# Patient Record
Sex: Male | Born: 1950 | Race: White | Hispanic: No | Marital: Single | Smoking: Former smoker
Health system: Southern US, Community
[De-identification: ages and names within clinical notes are randomized; demographics above are authoritative.]

## PROBLEM LIST (undated history)

## (undated) DIAGNOSIS — Z87442 Personal history of urinary calculi: Secondary | ICD-10-CM

## (undated) DIAGNOSIS — I447 Left bundle-branch block, unspecified: Secondary | ICD-10-CM

## (undated) DIAGNOSIS — J45909 Unspecified asthma, uncomplicated: Secondary | ICD-10-CM

## (undated) DIAGNOSIS — I5020 Unspecified systolic (congestive) heart failure: Secondary | ICD-10-CM

## (undated) DIAGNOSIS — I2510 Atherosclerotic heart disease of native coronary artery without angina pectoris: Secondary | ICD-10-CM

## (undated) DIAGNOSIS — Q600 Renal agenesis, unilateral: Secondary | ICD-10-CM

## (undated) DIAGNOSIS — I10 Essential (primary) hypertension: Secondary | ICD-10-CM

## (undated) DIAGNOSIS — I639 Cerebral infarction, unspecified: Secondary | ICD-10-CM

## (undated) DIAGNOSIS — I428 Other cardiomyopathies: Secondary | ICD-10-CM

## (undated) HISTORY — PX: TRANSURETHRAL RESECTION OF PROSTATE: SHX73

## (undated) HISTORY — PX: KNEE SURGERY: SHX244

## (undated) HISTORY — DX: Unspecified asthma, uncomplicated: J45.909

## (undated) HISTORY — DX: Renal agenesis, unilateral: Q60.0

## (undated) HISTORY — PX: TONSILLECTOMY: SUR1361

## (undated) HISTORY — PX: KIDNEY STONE SURGERY: SHX686

## (undated) HISTORY — PX: TURP VAPORIZATION: SUR1397

---

## 2016-04-22 ENCOUNTER — Encounter (HOSPITAL_COMMUNITY): Payer: Self-pay

## 2016-04-22 ENCOUNTER — Emergency Department (HOSPITAL_COMMUNITY)
Admission: EM | Admit: 2016-04-22 | Discharge: 2016-04-23 | Disposition: A | Discharge status: Home / Self Care | Payer: BLUE CROSS/BLUE SHIELD | Attending: Emergency Medicine | Admitting: Emergency Medicine

## 2016-04-22 DIAGNOSIS — K269 Duodenal ulcer, unspecified as acute or chronic, without hemorrhage or perforation: Secondary | ICD-10-CM | POA: Diagnosis not present

## 2016-04-22 DIAGNOSIS — K200 Eosinophilic esophagitis: Secondary | ICD-10-CM | POA: Insufficient documentation

## 2016-04-22 DIAGNOSIS — K222 Esophageal obstruction: Secondary | ICD-10-CM | POA: Diagnosis not present

## 2016-04-22 DIAGNOSIS — K2980 Duodenitis without bleeding: Secondary | ICD-10-CM

## 2016-04-22 DIAGNOSIS — X58XXXA Exposure to other specified factors, initial encounter: Secondary | ICD-10-CM | POA: Diagnosis not present

## 2016-04-22 DIAGNOSIS — T18128A Food in esophagus causing other injury, initial encounter: Secondary | ICD-10-CM | POA: Diagnosis not present

## 2016-04-22 DIAGNOSIS — I10 Essential (primary) hypertension: Secondary | ICD-10-CM | POA: Insufficient documentation

## 2016-04-22 DIAGNOSIS — T18108A Unspecified foreign body in esophagus causing other injury, initial encounter: Secondary | ICD-10-CM

## 2016-04-22 HISTORY — DX: Essential (primary) hypertension: I10

## 2016-04-22 MED ORDER — SODIUM CHLORIDE 0.9 % IV SOLN
INTRAVENOUS | Status: DC
  Administered 2016-04-22: via INTRAVENOUS

## 2016-04-22 NOTE — ED Triage Notes (Signed)
I was eating chicken around 1830 and some is stuck in my throat per pt.  Can't eat or drink anything, and if I am just sitting my throat fills up with foam.

## 2016-04-22 NOTE — ED Provider Notes (Signed)
[facility] DEPT Provider Note   CSN: [id number] Arrival date & time: 04/22/16  2150  First Provider Contact:   First MD Initiated Contact with Patient 04/22/16 2308    By signing my name below, I, Emmanuella Mensah, attest that this documentation has been prepared under the direction and in the presence of Rolland Porter, MD. Electronically Signed: Judithann Sauger, ED Scribe. 04/22/16. 11:29 PM.    History   Chief Complaint Chief Complaint  Patient presents with  . Foreign Body    HPI Comments: Lucas Brock is a 66 y.o. male with a hx of hypertenstion who presents to the Emergency Department for evaluation s/p possible chicken stuck lower in his throat after eating chicken at 6:30 pm today. He states he only took a small bite. He reports an associated "tightness in throat" onset 2-3 days ago where he denies feeling as though food has a hard time getting down. He adds that he has difficulty swallowing his own saliva, causing him to have to spit in a bag. Pt is unable to eat or drink anything right now. No alleviating factors noted. He has not tried any medications PTA. He states that he is currently on Lisinopril and Metformin (but not for DM, but for pancreas maintenance) which he gets refilled at [facility] in [place], [place] by Dr. Dorna Mai. 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 He denies that he is current smoker but endorses an occasional use of ETOH. He reports a similar hx of these symptoms 5-6 years ago where he had the food  Removed by endoscopy  in [place], [place]. He states he did not have his esophagus stretched and had no follow up afterwards.  No fever, chills, cough, or shortness of breath.    The history is provided by the patient. No language interpreter was used.   [facility] in [place], [place], Dr Luciano Cutter  Past Medical History:  Diagnosis Date  . Hypertension     There are no active problems to display for this patient.   Past Surgical History:  Procedure  Laterality Date  . KIDNEY STONE SURGERY    . KNEE SURGERY    . TONSILLECTOMY    . TURP VAPORIZATION        Home Medications    Prior to Admission medications   Medication Sig Start Date End Date Taking? Authorizing Provider  pantoprazole (PROTONIX) 40 MG tablet Take 1 tablet (40 mg total) by mouth daily before breakfast. 04/23/16   Rogene Houston, MD    Family History History reviewed. No pertinent family history.  Social History Social History  Substance Use Topics  . Smoking status: Never Smoker  . Smokeless tobacco: Never Used  . Alcohol use Yes  drives a truck Pt self urinary caths   Allergies   Bactrim [sulfamethoxazole-trimethoprim]   Review of Systems Review of Systems  Constitutional: Negative for chills and fever.  HENT: Positive for trouble swallowing.   Respiratory: Negative for cough and shortness of breath.   All other systems reviewed and are negative.    Physical Exam Updated Vital Signs    ED Triage Vitals [04/22/16 2214]  Enc Vitals Group     BP 112/80     Pulse Rate 77     Resp 20     Temp 97.7 F (36.5 C)     Temp Source Oral     SpO2 94 %     Weight      Height      Head  Circumference      Peak Flow      Pain Score      Pain Loc      Pain Edu?      Excl. in [place]?    Vital signs normal    Physical Exam  Constitutional: He is oriented to person, place, and time. He appears well-developed and well-nourished.  Non-toxic appearance. He does not appear ill. No distress.  HENT:  Head: Normocephalic and atraumatic.  Right Ear: External ear normal.  Left Ear: External ear normal.  Nose: Nose normal. No mucosal edema or rhinorrhea.  Mouth/Throat: Oropharynx is clear and moist and mucous membranes are normal. No dental abscesses or uvula swelling.  Eyes: Conjunctivae and EOM are normal. Pupils are equal, round, and reactive to light.  Neck: Normal range of motion and full passive range of motion without pain. Neck supple.    Cardiovascular: Normal rate, regular rhythm and normal heart sounds.  Exam reveals no gallop and no friction rub.   No murmur heard. Pulmonary/Chest: Effort normal and breath sounds normal. No respiratory distress. He has no wheezes. He has no rhonchi. He has no rales. He exhibits no tenderness and no crepitus.  Abdominal: Soft. Normal appearance and bowel sounds are normal. He exhibits no distension. There is no tenderness. There is no rebound and no guarding.  Musculoskeletal: Normal range of motion. He exhibits no edema or tenderness.  Moves all extremities well.   Neurological: He is alert and oriented to person, place, and time. He has normal strength. No cranial nerve deficit.  Skin: Skin is warm, dry and intact. No rash noted. No erythema. No pallor.  Psychiatric: He has a normal mood and affect. His speech is normal and behavior is normal. His mood appears not anxious.  Nursing note and vitals reviewed.    ED Treatments / Results  DIAGNOSTIC STUDIES: Oxygen Saturation is 96% on RA, normal by my interpretation.     Labs (all labs ordered are listed, but only abnormal results are displayed) Results for orders placed or performed during the hospital encounter of 04/22/16  I-Stat Chem 8, ED  Result Value Ref Range   Sodium 144 135 - 145 mmol/L   Potassium 4.3 3.5 - 5.1 mmol/L   Chloride 106 101 - 111 mmol/L   BUN 27 (H) 6 - 20 mg/dL   Creatinine, Ser 1.10 0.61 - 1.24 mg/dL   Glucose, Bld 97 65 - 99 mg/dL   Calcium, Ion 1.25 (H) 1.12 - 1.23 mmol/L   TCO2 25 0 - 100 mmol/L   Hemoglobin 17.7 (H) 13.0 - 17.0 g/dL   HCT 52.0 39.0 - 52.0 %   Laboratory interpretation all normal except elevated Hb    EKG  EKG Interpretation None       Radiology No results found.  Procedures Procedures (including critical care time)  Medications Ordered in ED Medications  0.9 %  sodium chloride infusion ( Intravenous New Bag/Given 04/22/16 2349)     Initial Impression / Assessment  and Plan / ED Course  Rolland Porter, MD has reviewed the triage vital signs and the nursing notes.  Pertinent labs & imaging results that were available during my care of the patient were reviewed by me and considered in my medical decision making (see chart for details).  Clinical Course   Pt was given a cup with a small amount of water. He drank it and immediately vomited it back up.   11:27 PM- Pt advised of plan for  treatment and pt agrees. IV was started. Labs ordered. Will consult GI for further evaluation.   12:13 AM - Dr. Dereck Leep (GI) is calling in the endoscopy team to see pt tonight.   Final Clinical Impressions(s) / ED Diagnoses   Final diagnoses:  Esophageal foreign body, initial encounter   Disposition per Dr Laural Golden  (discharged home after EDG)  New Prescriptions Current Discharge Medication List    START taking these medications   Details  pantoprazole (PROTONIX) 40 MG tablet Take 1 tablet (40 mg total) by mouth daily before breakfast. Qty: 30 tablet, Refills: 5        I personally performed the services described in this documentation, which was scribed in my presence. The recorded information has been reviewed and considered.  Rolland Porter, MD, Barbette Or, MD 04/23/16 [phone]

## 2016-04-23 ENCOUNTER — Encounter (HOSPITAL_COMMUNITY): Payer: Self-pay | Admitting: *Deleted

## 2016-04-23 ENCOUNTER — Encounter (HOSPITAL_COMMUNITY)
Admission: EM | Disposition: A | Discharge status: Home / Self Care | Payer: Self-pay | Source: Home / Self Care | Attending: Emergency Medicine

## 2016-04-23 DIAGNOSIS — K3189 Other diseases of stomach and duodenum: Secondary | ICD-10-CM

## 2016-04-23 DIAGNOSIS — T18128A Food in esophagus causing other injury, initial encounter: Secondary | ICD-10-CM

## 2016-04-23 DIAGNOSIS — K209 Esophagitis, unspecified: Secondary | ICD-10-CM | POA: Diagnosis not present

## 2016-04-23 DIAGNOSIS — K222 Esophageal obstruction: Secondary | ICD-10-CM | POA: Diagnosis not present

## 2016-04-23 HISTORY — PX: ESOPHAGOGASTRODUODENOSCOPY: SHX5428

## 2016-04-23 LAB — I-STAT CHEM 8, ED
BUN: 27 mg/dL — AB (ref 6–20)
CALCIUM ION: 1.25 mmol/L — AB (ref 1.12–1.23)
CHLORIDE: 106 mmol/L (ref 101–111)
Creatinine, Ser: 1.1 mg/dL (ref 0.61–1.24)
GLUCOSE: 97 mg/dL (ref 65–99)
HCT: 52 % (ref 39.0–52.0)
Hemoglobin: 17.7 g/dL — ABNORMAL HIGH (ref 13.0–17.0)
Potassium: 4.3 mmol/L (ref 3.5–5.1)
SODIUM: 144 mmol/L (ref 135–145)
TCO2: 25 mmol/L (ref 0–100)

## 2016-04-23 SURGERY — EGD (ESOPHAGOGASTRODUODENOSCOPY)
Anesthesia: Moderate Sedation

## 2016-04-23 MED ORDER — STERILE WATER FOR IRRIGATION IR SOLN
Status: DC | PRN
  Administered 2016-04-23: 01:00:00

## 2016-04-23 MED ORDER — MEPERIDINE HCL 50 MG/ML IJ SOLN
INTRAMUSCULAR | Status: DC | PRN
  Administered 2016-04-23 (×2): 25 mg via INTRAVENOUS

## 2016-04-23 MED ORDER — BUTAMBEN-TETRACAINE-BENZOCAINE 2-2-14 % EX AERO
INHALATION_SPRAY | CUTANEOUS | Status: DC | PRN
  Administered 2016-04-23: 2 via TOPICAL

## 2016-04-23 MED ORDER — ONDANSETRON HCL 4 MG/2ML IJ SOLN
INTRAMUSCULAR | Status: AC
  Filled 2016-04-23: qty 2

## 2016-04-23 MED ORDER — MIDAZOLAM HCL 5 MG/5ML IJ SOLN
INTRAMUSCULAR | Status: AC
  Filled 2016-04-23: qty 10

## 2016-04-23 MED ORDER — MEPERIDINE HCL 50 MG/ML IJ SOLN
INTRAMUSCULAR | Status: AC
  Filled 2016-04-23: qty 1

## 2016-04-23 MED ORDER — MIDAZOLAM HCL 5 MG/5ML IJ SOLN
INTRAMUSCULAR | Status: DC | PRN
  Administered 2016-04-23 (×2): 2 mg via INTRAVENOUS
  Administered 2016-04-23 (×2): 1 mg via INTRAVENOUS

## 2016-04-23 MED ORDER — PANTOPRAZOLE SODIUM 40 MG PO TBEC: 40.0000 mg | DELAYED_RELEASE_TABLET | Freq: Every day | ORAL | 5 refills | Status: DC

## 2016-04-23 NOTE — H&P (Signed)
Lucas Brock is an 65 y.o. male.   Chief Complaint: Patient is here for EGD with foreign body removal and possible esophageal dilation. HPI: Patient is 65 year old Caucasian male who had esophageal foreign body removed at hospital in [place] 6 years ago and has been doing well until a few days ago when he noted some tightness throat. This evening while he was eating chicken he noted that it got stuck in his esophagus and he was unable to drink liquids. He feels is a very small piece. Came to emergency room. He was evaluated and I was asked see patient for EGD and foreign body removal. Patient denies chronic heartburn. He has good appetite. He has lost 25 pounds this year voluntarily. His goal is to get below 200 pounds.  Past Medical History:  Diagnosis Date  . Hypertension     Past Surgical History:  Procedure Laterality Date  . KIDNEY STONE SURGERY    . KNEE SURGERY    . TONSILLECTOMY    . TURP VAPORIZATION      No family history on file. Social History:  reports that he has never smoked. He has never used smokeless tobacco. He reports that he drinks alcohol. He reports that he does not use drugs.  Allergies:  Allergies  Allergen Reactions  . Bactrim [Sulfamethoxazole-Trimethoprim]      (Not in a hospital admission)  Results for orders placed or performed during the hospital encounter of 04/22/16 (from the past 48 hour(s))  I-Stat Chem 8, ED     Status: Abnormal   Collection Time: 04/22/16 11:58 PM  Result Value Ref Range   Sodium 144 135 - 145 mmol/L   Potassium 4.3 3.5 - 5.1 mmol/L   Chloride 106 101 - 111 mmol/L   BUN 27 (H) 6 - 20 mg/dL   Creatinine, Ser 1.10 0.61 - 1.24 mg/dL   Glucose, Bld 97 65 - 99 mg/dL   Calcium, Ion 1.25 (H) 1.12 - 1.23 mmol/L   TCO2 25 0 - 100 mmol/L   Hemoglobin 17.7 (H) 13.0 - 17.0 g/dL   HCT 52.0 39.0 - 52.0 %   No results found.  ROS  Blood pressure 105/82, pulse 81, temperature 97.5 F (36.4 C), resp. rate 18, SpO2 93  %. Physical Exam  Constitutional: He appears well-developed and well-nourished.  HENT:  Mouth/Throat: Oropharynx is clear and moist.  Eyes: Conjunctivae are normal.  Neck: No thyromegaly present.  Cardiovascular: Normal rate, regular rhythm and normal heart sounds.   No murmur heard. Respiratory: Effort normal and breath sounds normal.  GI: Soft. He exhibits no distension and no mass. There is no tenderness.  Musculoskeletal: He exhibits no edema.  Lymphadenopathy:    He has no cervical adenopathy.  Neurological: He is alert.  Skin: Skin is warm and dry.     Assessment/Plan Esophageal foreign body. EGD with foreign the body removal and esophageal dilation.  Hildred Laser, MD 04/23/2016, 1:08 AM

## 2016-04-23 NOTE — Op Note (Signed)
[facility] Patient Name: Lucas Brock Procedure Date: 04/23/2016 1:13 AM MRN: [id number] Date of Birth: 28-Feb-1951 Attending MD: Hildred Laser , MD CSN: [id number] Age: 65 Admit Type: Emergency Department Procedure:                Upper GI endoscopy Indications:              Foreign body in the esophagus Providers:                Hildred Laser, MD, Rosina Lowenstein, RN, Purcell Nails.                            Tina Griffiths, Technician Referring MD:             Rolland Porter, MD Medicines:                Cetacaine spray, Ondansetron 6 mg IV, Meperidine 50                            mg IV, Midazolam 6 mg IV Complications:            No immediate complications. Estimated Blood Loss:     Estimated blood loss was minimal. Procedure:                Pre-Anesthesia Assessment:                           - Prior to the procedure, a History and Physical                            was performed, and patient medications and                            allergies were reviewed. The patient's tolerance of                            previous anesthesia was also reviewed. The risks                            and benefits of the procedure and the sedation                            options and risks were discussed with the patient.                            All questions were answered, and informed consent                            was obtained. Prior Anticoagulants: The patient has                            taken no previous anticoagulant or antiplatelet                            agents. ASA Grade Assessment: II - A patient with  mild systemic disease. After reviewing the risks                            and benefits, the patient was deemed in                            satisfactory condition to undergo the procedure.                           After obtaining informed consent, the endoscope was                            passed under direct vision. Throughout the            procedure, the patient's blood pressure, pulse, and                            oxygen saturations were monitored continuously. The                            [id number]) scope was introduced through the                            and advanced to the second part of duodenum. The                            upper GI endoscopy was accomplished without                            difficulty. The patient tolerated the procedure                            well. Scope In: 1:22:18 AM Scope Out: 1:39:38 AM Total Procedure Duration: 0 hours 17 minutes 20 seconds  Findings:      Food was found in the lower third of the esophagus. Removal was       accomplished with a Roth net and snare.      Mucosal changes including ringed esophagus, longitudinal furrows and       stenosis were found in the middle third of the esophagus and in the       lower third of the esophagus. A TTS dilator was passed through the       scope. Dilation with a 12-13.5-15 mm x 5.5 cm CRE balloon dilator was       performed to 15 mm. The dilation site was examined and showed moderate       improvement in luminal narrowing. Biopsies were taken with a cold       forceps for histology.      LA Grade B (one or more mucosal breaks greater than 5 mm, not extending       between the tops of two mucosal folds) esophagitis with no bleeding was       found 39 cm from the incisors.      The Z-line was regular and was found 39 cm from the incisors.      The entire examined stomach was normal.      Multiple erosions  without bleeding were found in the duodenal bulb.      The second portion of the duodenum was normal. Impression:               - Food was found in the esophagus. Removal was                            successful.                           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 Dilated. Biopsied.                           - LA Grade B acute esophagitis.                           - Z-line regular, 39 cm from the incisors.                           - Normal stomach.                           - Duodenal erosions without bleeding.                           - Normal second portion of the duodenum. Moderate Sedation:      Moderate (conscious) sedation was administered by the endoscopy nurse       and supervised by the endoscopist. The following parameters were       monitored: oxygen saturation, heart rate, blood pressure, CO2       capnography and response to care. Total physician intraservice time was       23 minutes. Recommendation:           - Patient has a contact number available for                            emergencies. The signs and symptoms of potential                            delayed complications were discussed with the                            patient. Return to normal activities tomorrow.                            Written discharge instructions were provided to the                            patient.                           -  Mechanical soft diet for 2 days.                           - Continue present medications.                           - No aspirin, ibuprofen, naproxen, or other                            non-steroidal anti-inflammatory drugs for 3 days.                           - Await pathology results.                           - Use Protonix (pantoprazole) 40 mg PO daily.                           - Await pathology results.                           - Return to GI clinic in 2 months. Procedure Code(s):        --- Professional ---                           [phone], Esophagogastroduodenoscopy, flexible,                            transoral; with removal of foreign body(s)                           43249, Esophagogastroduodenoscopy, flexible,                            transoral; with transendoscopic balloon dilation of                             esophagus (less than 30 mm diameter)                           43239, Esophagogastroduodenoscopy, flexible,                            transoral; with biopsy, single or multiple                           99152, Moderate sedation services provided by the                            same physician or other qualified health care                            professional performing the diagnostic or                            therapeutic service that the sedation supports,  requiring the presence of an independent trained                            observer to assist in the monitoring of the                            patient's level of consciousness and physiological                            status; initial 15 minutes of intraservice time,                            patient age 34 years or older                           [phone], Moderate sedation services; each additional                            15 minutes intraservice time Diagnosis Code(s):        --- Professional ---                           [phone], Food in esophagus causing other injury,                            initial encounter                           K20.9, Esophagitis, unspecified                           K26.9, Duodenal ulcer, unspecified as acute or                            chronic, without hemorrhage or perforation                           T18.108A, Unspecified foreign body in esophagus                            causing other injury, initial encounter CPT copyright 2016 American Medical Association. All rights reserved. The codes documented in this report are preliminary and upon coder review may  be revised to meet current compliance requirements. Hildred Laser, MD Hildred Laser, MD 04/23/2016 2:02:34 AM This report has been signed electronically. Number of Addenda: 0

## 2016-04-23 NOTE — Discharge Instructions (Signed)
No aspirin or NSAIDs for 3 days. Resume usual medications. Pantoprazole 40 mg by mouth 30 minutes before breakfast daily. Soft diet for 2 days. No driving for 24 hours. Physician will call with biopsy results.  Gastrointestinal Endoscopy, Care After Refer to this sheet in the next few weeks. These instructions provide you with information on caring for yourself after your procedure. Your caregiver may also give you more specific instructions. Your treatment has been planned according to current medical practices, but problems sometimes occur. Call your caregiver if you have any problems or questions after your procedure. HOME CARE INSTRUCTIONS  If you were given medicine to help you relax (sedative), do not drive, operate machinery, or sign important documents for 24 hours.  Avoid alcohol and hot or warm beverages for the first 24 hours after the procedure.  Only take over-the-counter or prescription medicines for pain, discomfort, or fever as directed by your caregiver. You may resume taking your normal medicines unless your caregiver tells you otherwise. Ask your caregiver when you may resume taking medicines that may cause bleeding, such as aspirin, clopidogrel, or warfarin.  You may return to your normal diet and activities on the day after your procedure, or as directed by your caregiver. Walking may help to reduce any bloated feeling in your abdomen.  Drink enough fluids to keep your urine clear or pale yellow.  You may gargle with salt water if you have a sore throat. SEEK IMMEDIATE MEDICAL CARE IF:  You have severe nausea or vomiting.  You have severe abdominal pain, abdominal cramps that last longer than 6 hours, or abdominal swelling (distention).  You have severe shoulder or back pain.  You have trouble swallowing.  You have shortness of breath, your breathing is shallow, or you are breathing faster than normal.  You have a fever or a rapid heartbeat.  You vomit blood  or material that looks like coffee grounds.  You have bloody, black, or tarry stools. MAKE SURE YOU:  Understand these instructions.  Will watch your condition.  Will get help right away if you are not doing well or get worse.   This information is not intended to replace advice given to you by your health care provider. Make sure you discuss any questions you have with your health care provider.   Document Released: 04/12/2004 Document Revised: 09/19/2014 Document Reviewed: 11/29/2011 Elsevier Interactive Patient Education 2016 [place] Meal Plan A soft-food meal plan includes foods that are safe and easy to swallow. This meal plan typically is used:  If you are having trouble chewing or swallowing foods.  As a transition meal plan after only having had liquid meals for a long period. WHAT DO I NEED TO KNOW ABOUT THE SOFT-FOOD MEAL PLAN? A soft-food meal plan includes tender foods that are soft and easy to chew and swallow. In most cases, bite-sized pieces of food are easier to swallow. A bite-sized piece is about  inch or smaller. Foods in this plan do not need to be ground or pureed. Foods that are very hard, crunchy, or sticky should be avoided. Also, breads, cereals, yogurts, and desserts with nuts, seeds, or fruits should be avoided. WHAT FOODS CAN I EAT? Grains Rice and wild rice. Moist bread, dressing, pasta, and noodles. Well-moistened dry or cooked cereals, such as farina (cooked wheat cereal), oatmeal, or grits. Biscuits, breads, muffins, pancakes, and waffles that have been well moistened. Vegetables Shredded lettuce. Cooked, tender vegetables, including potatoes without skins. Vegetable juices.  Broths or creamed soups made with vegetables that are not stringy or chewy. Strained tomatoes (without seeds). Fruits Canned or well-cooked fruits. Soft (ripe), peeled fresh fruits, such as peaches, nectarines, kiwi, cantaloupe, honeydew melon, and watermelon  (without seeds). Soft berries with small seeds, such as strawberries. Fruit juices (without pulp). Meats and Other Protein Sources Moist, tender, lean beef. Mutton. Lamb. Veal. Chicken. [place]. Liver. Ham. Fish without bones. Eggs. Dairy Milk, milk drinks, and cream. Plain cream cheese and cottage cheese. Plain yogurt. Sweets/Desserts Flavored gelatin desserts. Custard. Plain ice cream, frozen yogurt, sherbet, milk shakes, and malts. Plain cakes and cookies. Plain hard candy.  Other Butter, margarine (without trans fat), and cooking oils. Mayonnaise. Cream sauces. Mild spices, salt, and sugar. Syrup, molasses, honey, and jelly. The items listed above may not be a complete list of recommended foods or beverages. Contact your dietitian for more options. WHAT FOODS ARE NOT RECOMMENDED? Grains Dry bread, toast, crackers that have not been moistened. Coarse or dry cereals, such as bran, granola, and shredded wheat. Tough or chewy crusty breads, such as [place] bread or baguettes. Vegetables Corn. Raw vegetables except shredded lettuce. Cooked vegetables that are tough or stringy. Tough, crisp, fried potatoes and potato skins. Fruits Fresh fruits with skins or seeds or both, such as apples, pears, or grapes. Stringy, high-pulp fruits, such as papaya, pineapple, coconut, or mango. Fruit leather, fruit roll-ups, and all dried fruits. Meats and Other Protein Sources Sausages and hot dogs. Meats with gristle. Fish with bones. Nuts, seeds, and chunky peanut or other nut butters. Sweets/Desserts Cakes or cookies that are very dry or chewy.  The items listed above may not be a complete list of foods and beverages to avoid. Contact your dietitian for more information.   This information is not intended to replace advice given to you by your health care provider. Make sure you discuss any questions you have with your health care provider.   Document Released: 12/06/2007 Document Revised: 09/03/2013 Document  Reviewed: 07/26/2013 Elsevier Interactive Patient Education [employer].

## 2016-04-24 LAB — H. PYLORI ANTIBODY, IGG: H Pylori IgG: 1.8 U/mL — ABNORMAL HIGH (ref 0.0–0.8)

## 2016-04-27 ENCOUNTER — Other Ambulatory Visit (INDEPENDENT_AMBULATORY_CARE_PROVIDER_SITE_OTHER): Payer: Self-pay | Admitting: Internal Medicine

## 2016-04-27 MED ORDER — FLUTICASONE PROPIONATE HFA 220 MCG/ACT IN AERO: INHALATION_SPRAY | RESPIRATORY_TRACT | 0 refills | Status: DC

## 2016-04-28 ENCOUNTER — Encounter (INDEPENDENT_AMBULATORY_CARE_PROVIDER_SITE_OTHER): Payer: Self-pay | Admitting: *Deleted

## 2016-05-23 ENCOUNTER — Encounter (HOSPITAL_COMMUNITY): Payer: Self-pay | Admitting: Internal Medicine

## 2016-06-20 ENCOUNTER — Ambulatory Visit (INDEPENDENT_AMBULATORY_CARE_PROVIDER_SITE_OTHER): Payer: BLUE CROSS/BLUE SHIELD | Admitting: Internal Medicine

## 2016-06-20 ENCOUNTER — Encounter (INDEPENDENT_AMBULATORY_CARE_PROVIDER_SITE_OTHER): Payer: Self-pay | Admitting: Internal Medicine

## 2016-06-21 ENCOUNTER — Ambulatory Visit (INDEPENDENT_AMBULATORY_CARE_PROVIDER_SITE_OTHER): Payer: BLUE CROSS/BLUE SHIELD | Admitting: Internal Medicine

## 2016-07-08 ENCOUNTER — Telehealth (INDEPENDENT_AMBULATORY_CARE_PROVIDER_SITE_OTHER): Payer: Self-pay | Admitting: Internal Medicine

## 2016-07-08 NOTE — Telephone Encounter (Signed)
Patient presented to the office this morning thinking he had an appointment.  In reviewing his appointments, found that he missed an appointment on 06/20/16 and cancelled his appointment for 06/21/16.  There are no further appointment scheduled for him.  I offered to reschedule him for Monday, October 30 and the stated no and left with out scheduling any appointments.  [phone]

## 2018-09-12 DIAGNOSIS — J189 Pneumonia, unspecified organism: Secondary | ICD-10-CM

## 2018-09-12 HISTORY — DX: Pneumonia, unspecified organism: J18.9

## 2020-09-12 DIAGNOSIS — R0600 Dyspnea, unspecified: Secondary | ICD-10-CM

## 2020-09-12 HISTORY — DX: Dyspnea, unspecified: R06.00

## 2021-09-09 ENCOUNTER — Ambulatory Visit
Admission: EM | Admit: 2021-09-09 | Discharge: 2021-09-09 | Disposition: A | Discharge status: Home / Self Care | Payer: BC Managed Care – PPO | Attending: Emergency Medicine | Admitting: Emergency Medicine

## 2021-09-09 ENCOUNTER — Other Ambulatory Visit: Payer: Self-pay

## 2021-09-09 DIAGNOSIS — J209 Acute bronchitis, unspecified: Secondary | ICD-10-CM

## 2021-09-09 MED ORDER — ALBUTEROL SULFATE HFA 108 (90 BASE) MCG/ACT IN AERS: 1.0000 | INHALATION_SPRAY | RESPIRATORY_TRACT | 0 refills | Status: DC | PRN

## 2021-09-09 MED ORDER — AEROCHAMBER PLUS MISC: 2 refills | Status: DC

## 2021-09-09 MED ORDER — PREDNISONE 10 MG (21) PO TBPK: ORAL_TABLET | ORAL | 0 refills | Status: DC

## 2021-09-09 MED ORDER — HYDROCOD POLST-CPM POLST ER 10-8 MG/5ML PO SUER: 5.0000 mL | Freq: Two times a day (BID) | ORAL | 0 refills | Status: DC | PRN

## 2021-09-09 NOTE — ED Provider Notes (Signed)
HPI  SUBJECTIVE:  Lucas Brock is a 70 y.o. male who presents with 3 to 4 days of chest congestion, sore throat, cough, wheezing, postnasal drip, shortness of breath, dyspnea on exertion.  No fevers, body aches, headaches, nasal congestion, sinus pain or pressure, chest pain.  States that he is unable to sleep at night secondary to the coughing and wheezing.  He was on antibiotics for a UTI within the past 3 months.  No antipyretic in the past 6 hours.  He has been taking 2 puffs from his albuterol inhaler every 2 hours and Coricidin nighttime liquid with improvement in his symptoms.  No aggravating factors.  He states that this is identical to previous episodes of bronchitis.  He has a past medical history of hypertension, and has been evaluated by pulmonary for pulmonary disease.  He states his PFTs are normal.  No history of diabetes.  PMD: In [place], [place].    Past Medical History:  Diagnosis Date   Hypertension     Past Surgical History:  Procedure Laterality Date   ESOPHAGOGASTRODUODENOSCOPY N/A 04/23/2016   Procedure: ESOPHAGOGASTRODUODENOSCOPY (EGD);  Surgeon: Rogene Houston, MD;  [facility];  Service: Endoscopy;  Laterality: N/A;   KIDNEY STONE SURGERY     KNEE SURGERY     TONSILLECTOMY     TURP VAPORIZATION      History reviewed. No pertinent family history.  Social History   Tobacco Use   Smoking status: Never   Smokeless tobacco: Never  Substance Use Topics   Alcohol use: Yes   Drug use: No    No current facility-administered medications for this encounter.  Current Outpatient Medications:    albuterol (VENTOLIN HFA) 108 (90 Base) MCG/ACT inhaler, Inhale 1-2 puffs into the lungs every 4 (four) hours as needed for wheezing or shortness of breath., Disp: 1 each, Rfl: 0   carvedilol (COREG) 6.25 MG tablet, Take 6.25 mg by mouth 2 (two) times daily with a meal., Disp: , Rfl:    chlorpheniramine-HYDROcodone (TUSSIONEX PENNKINETIC ER) 10-8 MG/5ML  SUER, Take 5 mLs by mouth every 12 (twelve) hours as needed for cough., Disp: 60 mL, Rfl: 0   predniSONE (STERAPRED UNI-PAK 21 TAB) 10 MG (21) TBPK tablet, Dispense one 6 day pack. Take as directed with food., Disp: 21 tablet, Rfl: 0   Spacer/Aero-Holding Chambers (AEROCHAMBER PLUS) inhaler, Use with inhaler, Disp: 1 each, Rfl: 2   ENTRESTO 24-26 MG, Take 1 tablet by mouth 2 (two) times daily., Disp: , Rfl:    fluticasone (FLOVENT HFA) 220 MCG/ACT inhaler, 4 puffs twice daily as directed for total of 6 weeks., Disp: 4 Inhaler, Rfl: 0   pantoprazole (PROTONIX) 40 MG tablet, Take 1 tablet (40 mg total) by mouth daily before breakfast., Disp: 30 tablet, Rfl: 5  Allergies  Allergen Reactions   Bactrim [Sulfamethoxazole-Trimethoprim]      ROS  As noted in HPI.   Physical Exam  BP (!) 159/105 (BP Location: Right Arm)    Pulse 73    Temp 98.9 F (37.2 C) (Oral)    Resp (!) 22    SpO2 94%   Constitutional: Well developed, well nourished, no acute distress Eyes:  EOMI, conjunctiva normal bilaterally HENT: Normocephalic, atraumatic,mucus membranes moist no nasal congestion.  No sinus tenderness. Respiratory: Normal inspiratory effort, diffuse expiratory wheezing throughout all lung fields.  Fair air movement. Cardiovascular: Normal rate GI: nondistended skin: No rash, skin intact Musculoskeletal: no deformities Neurologic: Alert & oriented x 3, no focal neuro  deficits Psychiatric: Speech and behavior appropriate   ED Course   Medications - No data to display  No orders of the defined types were placed in this encounter.   No results found for this or any previous visit (from the past 24 hour(s)). No results found.  ED Clinical Impression  1. Bronchospasm with bronchitis, acute      ED Assessment/Plan  Patient with a bronchitis/bronchospasm.  Home with regularly scheduled albuterol inhaler with a spacer, prednisone, cough syrup.  Return here or follow-up with his doctor in 5  days if not better, we can reevaluate for pneumonia and the need for antibiotics.  Patient is amenable to this plan.  X-ray not available today.  Do not think that he has a pneumonia today.  [facility] narcotic database reviewed.  No opiate prescriptions in 2 years.  Discussed MDM, treatment plan, and plan for follow-up with patient. patient agrees with plan.   Meds ordered this encounter  Medications   albuterol (VENTOLIN HFA) 108 (90 Base) MCG/ACT inhaler    Sig: Inhale 1-2 puffs into the lungs every 4 (four) hours as needed for wheezing or shortness of breath.    Dispense:  1 each    Refill:  0   Spacer/Aero-Holding Chambers (AEROCHAMBER PLUS) inhaler    Sig: Use with inhaler    Dispense:  1 each    Refill:  2    Please educate patient on use   chlorpheniramine-HYDROcodone (TUSSIONEX PENNKINETIC ER) 10-8 MG/5ML SUER    Sig: Take 5 mLs by mouth every 12 (twelve) hours as needed for cough.    Dispense:  60 mL    Refill:  0   predniSONE (STERAPRED UNI-PAK 21 TAB) 10 MG (21) TBPK tablet    Sig: Dispense one 6 day pack. Take as directed with food.    Dispense:  21 tablet    Refill:  0      *This clinic note was created using [employer]. Therefore, there may be occasional mistakes despite careful proofreading.  ?    Melynda Ripple, MD 09/10/21 1224

## 2021-09-09 NOTE — Discharge Instructions (Addendum)
2 puffs from your albuterol inhaler using your spacer every 4 hours for 2 days, then every 6 hours for 2 days, then as needed.  You may back off on the albuterol if you start to feel better sooner.  Finish the prednisone, even if you feel better.  Cough syrup as needed.  Return here in 5 days if not any better we can reevaluate the need for antibiotics.

## 2021-09-09 NOTE — ED Triage Notes (Signed)
Pt reports cough, wheezing and sore throat x 4 days. Pt is concern for bronchitis. Albuterol inhaler gives some relief.

## 2021-10-04 ENCOUNTER — Other Ambulatory Visit: Payer: Self-pay

## 2021-10-04 ENCOUNTER — Ambulatory Visit (HOSPITAL_COMMUNITY)
Admission: RE | Admit: 2021-10-04 | Discharge: 2021-10-04 | Disposition: A | Discharge status: Home / Self Care | Payer: BC Managed Care – PPO | Source: Ambulatory Visit | Attending: Urology | Admitting: Urology

## 2021-10-04 ENCOUNTER — Other Ambulatory Visit (HOSPITAL_COMMUNITY): Payer: Self-pay | Admitting: Urology

## 2021-10-04 DIAGNOSIS — D4101 Neoplasm of uncertain behavior of right kidney: Secondary | ICD-10-CM

## 2021-10-07 ENCOUNTER — Other Ambulatory Visit: Payer: Self-pay | Admitting: Urology

## 2021-10-07 DIAGNOSIS — D4101 Neoplasm of uncertain behavior of right kidney: Secondary | ICD-10-CM

## 2021-11-08 ENCOUNTER — Other Ambulatory Visit: Payer: BC Managed Care – PPO

## 2021-11-10 ENCOUNTER — Ambulatory Visit
Admission: RE | Admit: 2021-11-10 | Discharge: 2021-11-10 | Disposition: A | Discharge status: Home / Self Care | Payer: BC Managed Care – PPO | Source: Ambulatory Visit | Attending: Urology | Admitting: Urology

## 2021-11-10 ENCOUNTER — Other Ambulatory Visit: Payer: Self-pay

## 2021-11-10 DIAGNOSIS — D4101 Neoplasm of uncertain behavior of right kidney: Secondary | ICD-10-CM

## 2021-11-10 MED ORDER — GADOBENATE DIMEGLUMINE 529 MG/ML IV SOLN
20.0000 mL | Freq: Once | INTRAVENOUS | Status: AC | PRN
  Administered 2021-11-10: 20 mL via INTRAVENOUS

## 2021-12-03 ENCOUNTER — Encounter: Payer: Self-pay | Admitting: Emergency Medicine

## 2021-12-03 ENCOUNTER — Ambulatory Visit
Admission: EM | Admit: 2021-12-03 | Discharge: 2021-12-03 | Disposition: A | Discharge status: Home / Self Care | Payer: BC Managed Care – PPO | Attending: Student | Admitting: Student

## 2021-12-03 ENCOUNTER — Other Ambulatory Visit: Payer: Self-pay

## 2021-12-03 DIAGNOSIS — J4521 Mild intermittent asthma with (acute) exacerbation: Secondary | ICD-10-CM

## 2021-12-03 MED ORDER — PREDNISONE 10 MG (21) PO TBPK: ORAL_TABLET | Freq: Every day | ORAL | 0 refills | Status: DC

## 2021-12-03 MED ORDER — PROMETHAZINE-DM 6.25-15 MG/5ML PO SYRP: 5.0000 mL | ORAL_SOLUTION | Freq: Four times a day (QID) | ORAL | 0 refills | Status: DC | PRN

## 2021-12-03 MED ORDER — BENZONATATE 100 MG PO CAPS: 100.0000 mg | ORAL_CAPSULE | Freq: Three times a day (TID) | ORAL | 0 refills | Status: DC

## 2021-12-03 MED ORDER — ALBUTEROL SULFATE HFA 108 (90 BASE) MCG/ACT IN AERS: 1.0000 | INHALATION_SPRAY | Freq: Four times a day (QID) | RESPIRATORY_TRACT | 0 refills | Status: DC | PRN

## 2021-12-03 NOTE — ED Provider Notes (Signed)
?[facility] ? ? ? ?CSN: [id number] ?Arrival date & time: 12/03/21  4098 ? ? ?  ? ?History   ?Chief Complaint ?Chief Complaint  ?Patient presents with  ? wheezing, cough  ? ? ?HPI ?Lucas Brock is a 71 y.o. male presenting with wheezing and cough for about 1 week.  History of asthma, never smoker.  Describes dry cough, worse at night.  This is not associated with any productive sputum.  Some dyspnea throughout the day, improved with albuterol inhaler, which she is using about 5 times daily.  Also used some prednisone that he had at home with some improvement.  States the cough is keeping him up at night.  Denies fever/chills, chest pain. ? ?HPI ? ?Past Medical History:  ?Diagnosis Date  ? Hypertension   ? ? ?There are no problems to display for this patient. ? ? ?Past Surgical History:  ?Procedure Laterality Date  ? ESOPHAGOGASTRODUODENOSCOPY N/A 04/23/2016  ? Procedure: ESOPHAGOGASTRODUODENOSCOPY (EGD);  Surgeon: Rogene Houston, MD;  [facility];  Service: Endoscopy;  Laterality: N/A;  ? KIDNEY STONE SURGERY    ? KNEE SURGERY    ? TONSILLECTOMY    ? TURP VAPORIZATION    ? ? ? ? ? ?Home Medications   ? ?Prior to Admission medications   ?Medication Sig Start Date End Date Taking? Authorizing Provider  ?albuterol (VENTOLIN HFA) 108 (90 Base) MCG/ACT inhaler Inhale 1-2 puffs into the lungs every 6 (six) hours as needed for wheezing or shortness of breath. 12/03/21  Yes Hazel Sams, PA-C  ?benzonatate (TESSALON) 100 MG capsule Take 1 capsule (100 mg total) by mouth every 8 (eight) hours. 12/03/21  Yes Hazel Sams, PA-C  ?predniSONE (STERAPRED UNI-PAK 21 TAB) 10 MG (21) TBPK tablet Take by mouth daily. Take 6 tabs by mouth daily  for 2 days, then 5 tabs for 2 days, then 4 tabs for 2 days, then 3 tabs for 2 days, 2 tabs for 2 days, then 1 tab by mouth daily for 2 days 12/03/21  Yes Hazel Sams, PA-C  ?promethazine-dextromethorphan (PROMETHAZINE-DM) 6.25-15 MG/5ML syrup Take 5 mLs by  mouth 4 (four) times daily as needed for cough. 12/03/21  Yes Hazel Sams, PA-C  ?carvedilol (COREG) 6.25 MG tablet Take 6.25 mg by mouth 2 (two) times daily with a meal.    [provider]  ?chlorpheniramine-HYDROcodone (TUSSIONEX PENNKINETIC ER) 10-8 MG/5ML SUER Take 5 mLs by mouth every 12 (twelve) hours as needed for cough. 09/09/21   Melynda Ripple, MD  ?Delene Loll 24-26 MG Take 1 tablet by mouth 2 (two) times daily. 08/08/21   [provider]  ?fluticasone (FLOVENT HFA) 220 MCG/ACT inhaler 4 puffs twice daily as directed for total of 6 weeks. 04/27/16   Rogene Houston, MD  ?pantoprazole (PROTONIX) 40 MG tablet Take 1 tablet (40 mg total) by mouth daily before breakfast. 04/23/16   Rehman, Mechele Dawley, MD  ?Spacer/Aero-Holding Chambers (AEROCHAMBER PLUS) inhaler Use with inhaler 09/09/21   Melynda Ripple, MD  ? ? ?Family History ?History reviewed. No pertinent family history. ? ?Social History ?Social History  ? ?Tobacco Use  ? Smoking status: Never  ? Smokeless tobacco: Never  ?Substance Use Topics  ? Alcohol use: Yes  ? Drug use: No  ? ? ? ?Allergies   ?Bactrim [sulfamethoxazole-trimethoprim] and Ciprofloxacin ? ? ?Review of Systems ?Review of Systems  ?Constitutional:  Negative for appetite change, chills and fever.  ?HENT:  Positive for congestion. Negative for ear  pain, rhinorrhea, sinus pressure, sinus pain and sore throat.   ?Eyes:  Negative for redness and visual disturbance.  ?Respiratory:  Positive for cough and wheezing. Negative for chest tightness and shortness of breath.   ?Cardiovascular:  Negative for chest pain and palpitations.  ?Gastrointestinal:  Negative for abdominal pain, constipation, diarrhea, nausea and vomiting.  ?Genitourinary:  Negative for dysuria, frequency and urgency.  ?Musculoskeletal:  Negative for myalgias.  ?Neurological:  Negative for dizziness, weakness and headaches.  ?Psychiatric/Behavioral:  Negative for confusion.   ?All other systems reviewed and  are negative. ? ? ?Physical Exam ?Triage Vital Signs ?ED Triage Vitals  ?Enc Vitals Group  ?   BP 12/03/21 1049 (!) 146/80  ?   Pulse Rate 12/03/21 1049 63  ?   Resp 12/03/21 1049 18  ?   Temp 12/03/21 1049 97.8 ?F (36.6 ?C)  ?   Temp Source 12/03/21 1049 Oral  ?   SpO2 12/03/21 1049 93 %  ?   Weight --   ?   Height --   ?   Head Circumference --   ?   Peak Flow --   ?   Pain Score 12/03/21 1050 0  ?   Pain Loc --   ?   Pain Edu? --   ?   Excl. in [place]? --   ? ?No data found. ? ?Updated Vital Signs ?BP (!) 146/80 (BP Location: Right Arm)   Pulse 63   Temp 97.8 ?F (36.6 ?C) (Oral)   Resp 18   SpO2 93%  ? ?Visual Acuity ?Right Eye Distance:   ?Left Eye Distance:   ?Bilateral Distance:   ? ?Right Eye Near:   ?Left Eye Near:    ?Bilateral Near:    ? ?Physical Exam ?Vitals reviewed.  ?Constitutional:   ?   General: He is not in acute distress. ?   Appearance: Normal appearance. He is not ill-appearing.  ?HENT:  ?   Head: Normocephalic and atraumatic.  ?   Right Ear: Tympanic membrane, ear canal and external ear normal. No tenderness. No middle ear effusion. There is no impacted cerumen. Tympanic membrane is not perforated, erythematous, retracted or bulging.  ?   Left Ear: Tympanic membrane, ear canal and external ear normal. No tenderness.  No middle ear effusion. There is no impacted cerumen. Tympanic membrane is not perforated, erythematous, retracted or bulging.  ?   Nose: Nose normal. No congestion.  ?   Mouth/Throat:  ?   Mouth: Mucous membranes are moist.  ?   Pharynx: Uvula midline. No oropharyngeal exudate or posterior oropharyngeal erythema.  ?Eyes:  ?   Extraocular Movements: Extraocular movements intact.  ?   Pupils: Pupils are equal, round, and reactive to light.  ?Cardiovascular:  ?   Rate and Rhythm: Normal rate and regular rhythm.  ?   Heart sounds: Normal heart sounds.  ?Pulmonary:  ?   Effort: Pulmonary effort is normal.  ?   Breath sounds: Wheezing present. No decreased breath sounds, rhonchi or  rales.  ?   Comments: Expiratory wheezes throughout  ?Abdominal:  ?   Palpations: Abdomen is soft.  ?   Tenderness: There is no abdominal tenderness. There is no guarding or rebound.  ?Lymphadenopathy:  ?   Cervical: No cervical adenopathy.  ?   Right cervical: No superficial cervical adenopathy. ?   Left cervical: No superficial cervical adenopathy.  ?Neurological:  ?   General: No focal deficit present.  ?   Mental Status:  He is alert and oriented to person, place, and time.  ?Psychiatric:     ?   Mood and Affect: Mood normal.     ?   Behavior: Behavior normal.     ?   Thought Content: Thought content normal.     ?   Judgment: Judgment normal.  ? ? ? ?UC Treatments / Results  ?Labs ?(all labs ordered are listed, but only abnormal results are displayed) ?Labs Reviewed - No data to display ? ?EKG ? ? ?Radiology ?No results found. ? ?Procedures ?Procedures (including critical care time) ? ?Medications Ordered in UC ?Medications - No data to display ? ?Initial Impression / Assessment and Plan / UC Course  ?I have reviewed the triage vital signs and the nursing notes. ? ?Pertinent labs & imaging results that were available during my care of the patient were reviewed by me and considered in my medical decision making (see chart for details). ? ?  ? ?This patient is a very pleasant 71 y.o. year old male presenting with asthma exacerbation. Afebrile, nontachy. Nontoxic appearing. ? ?Prednisone taper, tessalon, albuterol, promethazine DM.  ? ?ED return precautions discussed. Patient verbalizes understanding and agreement.  ? ?-Coding Level 4 for acute exacerbation of chronic condition and prescription drug management. ?  ? ?Final Clinical Impressions(s) / UC Diagnoses  ? ?Final diagnoses:  ?Mild intermittent asthma with acute exacerbation  ? ? ? ?Discharge Instructions   ? ?  ?-Albuterol inhaler as needed for cough, wheezing, shortness of breath, 1 to 2 puffs every 6 hours as needed. ?-Tessalon (Benzonatate) as needed for  cough. Take one pill up to 3x daily (every 8 hours) ?-Prednisone taper for cough/bronchitis. I recommend taking this in the morning as it could give you energy.  Avoid NSAIDs like ibuprofen and alleve while taki

## 2021-12-03 NOTE — Discharge Instructions (Addendum)
-  Albuterol inhaler as needed for cough, wheezing, shortness of breath, 1 to 2 puffs every 6 hours as needed. ?-Tessalon (Benzonatate) as needed for cough. Take one pill up to 3x daily (every 8 hours) ?-Prednisone taper for cough/bronchitis. I recommend taking this in the morning as it could give you energy.  Avoid NSAIDs like ibuprofen and alleve while taking this medication as they can increase your risk of stomach upset and even GI bleeding when in combination with a steroid. You can continue tylenol (acetaminophen) up to '1000mg'$  3x daily. ?-Promethazine DM cough syrup for congestion/cough. This could make you drowsy, so take at night before bed. ?-Follow-up if symptoms persist despite treatment - shortness of breath, new chest pain, new fevers, etc.  ?

## 2021-12-03 NOTE — ED Triage Notes (Signed)
Wheezing and feeling SOB x 1 week with cough ?

## 2021-12-10 ENCOUNTER — Other Ambulatory Visit: Payer: Self-pay | Admitting: Urology

## 2021-12-15 NOTE — Patient Instructions (Addendum)
2 VISITORS ARE ALLOWED TO COME WITH YOU AND STAY IN THE WAITING ROOM ONLY DURING PRE OP AND PROCEDURE.   ? ?**NO VISITORS ARE ALLOWED IN THE SHORT STAY AREA OR RECOVERY ROOM!!** ? ?IF YOU WILL BE ADMITTED INTO THE HOSPITAL YOU ARE ALLOWED ONLY 4 SUPPORT PEOPLE DURING VISITATION HOURS ONLY (7 AM -8PM)   ?The support person(s) must pass our screening, gel in and out, and wear a mask at all times, including in the patient?s room. ?Patients must also wear a mask when staff or their support person are in the room. ?Visitors GUEST BADGE MUST BE WORN VISIBLY  ?One adult visitor may remain with you overnight and MUST be in the room by 8 P.M. ?  ? ? Your procedure is scheduled on: 01/03/22 ? ? Report to [facility] Main Entrance ? ?  Report to admitting at  10:30 AM ? ? Call this number if you have problems the morning of surgery [phone] ? ? Do not eat food or drink:After Midnight on 01/02/22 ? ?CLEAR LIQUID DIET ? ?Foods Allowed                                                                     Exclude ? ?Water, Black Coffee, and tea, regular and decaf             ALL SOLID FOODS! ?Plain Jell-O in any flavor  (No red)                                     ?Fruit ices (not with fruit pulp)                                            MILK, SOUPS, ?Iced Popsicles (No red)                                                                            ?Apple juices                                                                         ORANGE JUICE ?Sports drinks like Gatorade (No red)                                  ?Lightly seasoned clear broth or consume(fat free) ?Sugar/Sweetner  ? ?Nothing more by mouth after midnight the day of surgery ?  ?       If you have questions,  please contact your surgeon?s office. ? ? ?  ?  ?Oral Hygiene is also important to reduce your risk of infection.                                    ?Remember - BRUSH YOUR TEETH THE MORNING OF SURGERY WITH YOUR REGULAR TOOTHPASTE ? ? Do NOT smoke  after Midnight ? ? Take these medicines the morning of surgery with A SIP OF WATER: Carvedilol, Entresto, Pantoprazole.  Use your inhaler and bring it with you ? ?                  ?           You may not have any metal on your body including , jewelry, and body piercing ? ?           Do not wear  lotions, powders, cologne, or deodorant ? ?  ? ?            Men may shave face and neck. ? ? Do not bring valuables to the hospital. [facility] NOT ?            RESPONSIBLE   FOR VALUABLES. ? ? Contacts, dentures or bridgework may not be worn into surgery. ? ?  ? Patients discharged on the day of surgery will not be allowed to drive home.  Someone NEEDS to stay with you for the first 24 hours after anesthesia. ? ? Special Instructions: Bring a copy of your healthcare power of attorney and living will documents the day of surgery if you haven't scanned them before. ? ?            Please read over the following fact sheets you were given: IF [place] [phone] ? ?   [facility] - Preparing for Surgery ?Before surgery, you can play an important role.  Because skin is not sterile, your skin needs to be as free of germs as possible.  You can reduce the number of germs on your skin by washing with CHG (chlorahexidine gluconate) soap before surgery.  CHG is an antiseptic cleaner which kills germs and bonds with the skin to continue killing germs even after washing. ?Please DO NOT use if you have an allergy to CHG or antibacterial soaps.  If your skin becomes reddened/irritated stop using the CHG and inform your nurse when you arrive at Short Stay. You may shave your face/neck. ?Please follow these instructions carefully: ? 1.  Shower with CHG Soap the night before surgery and the  morning of Surgery. ? 2.  If you choose to wash your hair, wash your hair first as usual with your  normal  shampoo. ? 3.  After you shampoo, rinse your hair and body thoroughly to remove the   shampoo.                   ?         4.  Use CHG as you would any other liquid soap.  You can apply chg directly  to the skin and wash  ?                     Gently with a scrungie or clean washcloth. ? 5.  Apply the CHG Soap to your body ONLY FROM THE NECK DOWN.  Do not use on face/ open      ?                     Wound or open sores. Avoid contact with eyes, ears mouth and genitals (private parts).  ?                     Production manager,  Genitals (private parts) with your normal soap. ?            6.  Wash thoroughly, paying special attention to the area where your surgery  will be performed. ? 7.  Thoroughly rinse your body with warm water from the neck down. ? 8.  DO NOT shower/wash with your normal soap after using and rinsing off  the CHG Soap. ?               9.  Pat yourself dry with a clean towel. ?           10.  Wear clean pajamas. ?           11.  Place clean sheets on your bed the night of your first shower and do not  sleep with pets. ?Day of Surgery : ?Do not apply any lotions/deodorants the morning of surgery.  Please wear clean clothes to the hospital/surgery center. ? ?FAILURE TO FOLLOW THESE INSTRUCTIONS MAY RESULT IN THE CANCELLATION OF YOUR SURGERY ?PATIENT SIGNATURE_________________________________ ? ?NURSE SIGNATURE__________________________________ ? ?________________________________________________________________________  ?

## 2021-12-20 ENCOUNTER — Other Ambulatory Visit: Payer: Self-pay

## 2021-12-20 ENCOUNTER — Encounter (HOSPITAL_COMMUNITY)
Admission: RE | Admit: 2021-12-20 | Discharge: 2021-12-20 | Disposition: A | Discharge status: Home / Self Care | Payer: BC Managed Care – PPO | Source: Ambulatory Visit | Attending: Urology | Admitting: Urology

## 2021-12-20 ENCOUNTER — Other Ambulatory Visit (HOSPITAL_COMMUNITY): Payer: BC Managed Care – PPO

## 2021-12-20 ENCOUNTER — Encounter (HOSPITAL_COMMUNITY): Payer: Self-pay

## 2021-12-20 VITALS — BP 153/98 | HR 66 | Temp 98.1°F | Resp 18 | Ht 68.0 in | Wt 227.0 lb

## 2021-12-20 DIAGNOSIS — Z01818 Encounter for other preprocedural examination: Secondary | ICD-10-CM | POA: Diagnosis present

## 2021-12-20 HISTORY — DX: Left bundle-branch block, unspecified: I44.7

## 2021-12-20 HISTORY — DX: Personal history of urinary calculi: Z87.442

## 2021-12-20 HISTORY — DX: Unspecified systolic (congestive) heart failure: I50.20

## 2021-12-20 HISTORY — DX: Cerebral infarction, unspecified: I63.9

## 2021-12-20 HISTORY — DX: Other cardiomyopathies: I42.8

## 2021-12-20 HISTORY — DX: Atherosclerotic heart disease of native coronary artery without angina pectoris: I25.10

## 2021-12-20 LAB — BASIC METABOLIC PANEL
Anion gap: 6 (ref 5–15)
BUN: 11 mg/dL (ref 8–23)
CO2: 26 mmol/L (ref 22–32)
Calcium: 9.1 mg/dL (ref 8.9–10.3)
Chloride: 109 mmol/L (ref 98–111)
Creatinine, Ser: 1.16 mg/dL (ref 0.61–1.24)
GFR, Estimated: >60 mL/min (ref >60)
Glucose, Bld: 119 mg/dL — ABNORMAL HIGH (ref 70–99)
Potassium: 3.7 mmol/L (ref 3.5–5.1)
Sodium: 141 mmol/L (ref 135–145)

## 2021-12-20 LAB — CBC
HCT: 44.7 % (ref 39.0–52.0)
Hemoglobin: 14.9 g/dL (ref 13.0–17.0)
MCH: 30.3 pg (ref 26.0–34.0)
MCHC: 33.3 g/dL (ref 30.0–36.0)
MCV: 90.9 fL (ref 80.0–100.0)
Platelets: 177 10*3/uL (ref 150–400)
RBC: 4.92 MIL/uL (ref 4.22–5.81)
RDW: 12.3 % (ref 11.5–15.5)
WBC: 7.2 10*3/uL (ref 4.0–10.5)
nRBC: 0 % (ref 0.0–0.2)

## 2021-12-20 LAB — TYPE AND SCREEN
ABO/RH(D): O POS
Antibody Screen: NEGATIVE

## 2021-12-20 NOTE — Progress Notes (Signed)
Anesthesia note: ? ?Bowel prep reminder:NA ? ?PCP - [place] , [place] ?Cardiologist -Df. Eliezer Mccoy , [place] ?Other- Pulmonologist- Dr. Jinny Blossom , [place] ? ?Chest x-ray - 10/05/21-epic ?EKG - 12/20/21-chart ?Stress Test - no ?ECHO - no ?Cardiac Cath - no ? ?Pacemaker/ICD device last checked:NA ? ?Sleep Study - no ?CPAP -  ? ?Pt is pre diabetic-NA ?Fasting Blood Sugar -  ?Checks Blood Sugar _____ ? ?Blood Thinner:NA- Pt forgets to take the low dose ASA ?Blood Thinner Instructions: ?Aspirin Instructions: ?Last Dose: ? ?Anesthesia review: yes ? ?Patient denies shortness of breath, fever, cough and chest pain at PAT appointment ?Pt has SOB with wheezing over the last year. He has seen a pulmonologist and gets steroids. ? ?Patient verbalized understanding of instructions that were given to them at the PAT appointment. Patient was also instructed that they will need to review over the PAT instructions again at home before surgery. yes ?

## 2021-12-21 ENCOUNTER — Other Ambulatory Visit (HOSPITAL_COMMUNITY): Payer: BC Managed Care – PPO

## 2021-12-23 ENCOUNTER — Encounter (HOSPITAL_COMMUNITY): Payer: Self-pay

## 2021-12-23 NOTE — Anesthesia Preprocedure Evaluation (Addendum)
Anesthesia Evaluation  ?Patient identified by MRN, date of birth, ID band ?Patient awake ? ? ? ?Reviewed: ?Allergy & Precautions, NPO status , Patient's Chart, lab work & pertinent test results ? ?Airway ?Mallampati: III ? ?TM Distance: >3 FB ?Neck ROM: Full ? ? ? Dental ? ?(+) Dental Advisory Given, Missing,  ?  ?Pulmonary ?neg pulmonary ROS, former smoker,  ?  ?Pulmonary exam normal ?breath sounds clear to auscultation ? ? ? ? ? ? Cardiovascular ?hypertension, Pt. on medications ?+ CAD and +CHF  ?Normal cardiovascular exam+ Valvular Problems/Murmurs (mild AI, mild MR) AI and MR  ?Rhythm:Regular Rate:Normal ? ?EKG 12/20/21: NSR. LAD. LBBB ?? ?? ?CV: ?Echo 11/15/2021 ([facility]): ?1.  Moderate overall LV systolic dysfunction.  LVEF 40-45%. ?2.  Normal RV function. ?3.  Mild LV H. ?4.  Stage I diastolic dysfunction and impaired LV relaxation pattern. ?5.  Cardiac chamber imaging demonstrates moderate RV enlargement, mild LA enlargement ?6.  Valvular and Doppler imaging demonstrates mild aortic insufficiency, mild mitral regurgitation, and mild tricuspid regurgitation with normal estimated PASP ?7.  No pericardial effusion. ?8.  Paradoxical septal motion noted. ?? ?Cardiac cath 09/02/20 ([place], [place]) ?1. 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 Nonischemic cardiomyopathy (EF 40%) ?  ?Neuro/Psych ?TIAnegative psych ROS  ? GI/Hepatic ?negative GI ROS, Neg liver ROS,   ?Endo/Other  ?negative endocrine ROS ? Renal/GU ?negative Renal ROS  ?negative genitourinary ?  ?Musculoskeletal ?negative musculoskeletal ROS ?(+)  ? Abdominal ?  ?Peds ? Hematology ?negative hematology ROS ?(+)   ?Anesthesia Other Findings ?Right renal mass ? Reproductive/Obstetrics ? ?  ? ? ? ? ? ? ? ? ? ? ? ? ? ?  ?  ? ? ? ? ? ? ?Anesthesia Physical ?Anesthesia Plan ? ?ASA: 3 ? ?Anesthesia Plan: General  ? ?Post-op Pain Management: Tylenol PO (pre-op)* and Toradol IV (intra-op)*   ? ?Induction: Intravenous ? ?PONV Risk Score and Plan: 2 and Midazolam, Dexamethasone and Ondansetron ? ?Airway Management Planned: Oral ETT ? ?Additional Equipment:  ? ?Intra-op Plan:  ? ?Post-operative Plan: Extubation in OR ? ?Informed Consent: I have reviewed the patients History and Physical, chart, labs and discussed the procedure including the risks, benefits and alternatives for the proposed anesthesia with the patient or authorized representative who has indicated his/her understanding and acceptance.  ? ? ? ?Dental advisory given ? ?Plan Discussed with: CRNA ? ?Anesthesia Plan Comments: (2 IVs)  ? ? ? ? ? ?Anesthesia Quick Evaluation ? ?

## 2021-12-23 NOTE — Progress Notes (Addendum)
Anesthesia Chart Review: ? ? Case: [id number] Date/Time: 01/03/22 1200  ? Procedure: XI ROBOTIC ASSITED PARTIAL NEPHRECTOMY, POSSIBLE RADICAL NEPHRECTOMY (Right) - ONLY NEEDS 240 MIN  ? Anesthesia type: General  ? Pre-op diagnosis: RIGHT RENAL MASS  ? Location: [facility] ROOM 03 / [facility]  ? Surgeons: Janith Lima, MD  ? ?  ? ? ?DISCUSSION: ?Pt is 71 years old with hx CAD (minor nonosbtructive on 2021 cath), HTN, LBBB, nonischemic cardiomyopathy (EF 40-45% on 11/15/21 echo), HFrEF, TIA ? ?VS: BP (!) 153/98   Pulse 66   Temp 36.7 ?C (Oral)   Resp 18   Ht '5\' 8"'$  (1.727 m)   Wt 103 kg   SpO2 95%   BMI 34.52 kg/m?  ? ?PROVIDERS: ?- PCP is Loretha Brasil, NP at [facility] in [place], [place] ?- Cardiologist is Orpah Greek, MD in [place], [place]. Last office visit 11/08/21 with Gerrie Nordmann, NP  ?- Pulmonologist is Dr. Ernestine Conrad in [place], [place] ? ? ?LABS: Labs reviewed: Acceptable for surgery. ?(all labs ordered are listed, but only abnormal results are displayed) ? ?Labs Reviewed  ?BASIC METABOLIC PANEL - Abnormal; Notable for the following components:  ?    Result Value  ? Glucose, Bld 119 (*)   ? All other components within normal limits  ?CBC  ?TYPE AND SCREEN  ? ? ? ?IMAGES: ?MR abdomen 11/10/21:  ?1. Complex cystic and solid exophytic mass at the lateral right kidney as described, should be considered renal cell carcinoma until proven otherwise. ?2. Numerous additional renal cortical cysts and a small hemorrhagic renal cyst. ?3. Hepatic steatosis. ?4. Left adrenal gland cyst. ?5. No lymphadenopathy visualized ? ?CXR 10/04/21:  ?- No active cardiopulmonary disease. ? ? ?EKG 12/20/21: NSR. LAD. LBBB ? ? ?CV: ?Echo 11/15/2021 ([facility]): ?1.  Moderate overall LV systolic dysfunction.  LVEF 40-45%. ?2.  Normal RV function. ?3.  Mild LV H. ?4.  Stage I diastolic dysfunction and impaired LV relaxation pattern. ?5.  Cardiac chamber imaging demonstrates moderate RV enlargement, mild LA  enlargement ?6.  Valvular and Doppler imaging demonstrates mild aortic insufficiency, mild mitral regurgitation, and mild tricuspid regurgitation with normal estimated PASP ?7.  No pericardial effusion. ?8.  Paradoxical septal motion noted. ? ?Cardiac cath 09/02/20 ([place], [place]) ?Minor nonobstructive CAD (LAD 10-15%) ?Nonischemic cardiomyopathy (EF 40%) ? ? ?Past Medical History:  ?Diagnosis Date  ? Coronary artery disease   ? Dyspnea 2022  ? with exertion  ? HFrEF (heart failure with reduced ejection fraction) ([place])   ? History of kidney stones   ? Hypertension   ? LBBB (left bundle branch block)   ? Nonischemic cardiomyopathy ([place])   ? Pneumonia 2020  ? Stroke [facility])   ? TIA age 39  ? ? ?Past Surgical History:  ?Procedure Laterality Date  ? ESOPHAGOGASTRODUODENOSCOPY N/A 04/23/2016  ? Procedure: ESOPHAGOGASTRODUODENOSCOPY (EGD);  Surgeon: Rogene Houston, MD;  Location: [facility];  Service: Endoscopy;  Laterality: N/A;  ? KIDNEY STONE SURGERY    ? age 89 with ruptured ureter  ? KNEE SURGERY Bilateral   ? 2013, LT, Rt 2003  ? TONSILLECTOMY    ? TRANSURETHRAL RESECTION OF PROSTATE    ? age 39  ? TURP VAPORIZATION    ? ? ?MEDICATIONS: ? acetaminophen (TYLENOL) 325 MG tablet  ? aspirin EC 81 MG tablet  ? carvedilol (COREG) 6.25 MG tablet  ? ENTRESTO 24-26 MG  ? ibuprofen (ADVIL) 200 MG tablet  ? ?  No current facility-administered medications for this encounter.  ? ? ?If no changes, I anticipate pt can proceed with surgery as scheduled.  ? ?Willeen Cass, PhD, FNP-BC ?[facility]/Anesthesiology ?Phone: [phone] ?12/23/2021 1:54 PM  ? ? ? ? ? ?

## 2022-01-03 ENCOUNTER — Ambulatory Visit (HOSPITAL_COMMUNITY): Payer: BC Managed Care – PPO | Admitting: Certified Registered"

## 2022-01-03 ENCOUNTER — Other Ambulatory Visit: Payer: Self-pay

## 2022-01-03 ENCOUNTER — Ambulatory Visit (HOSPITAL_COMMUNITY): Payer: BC Managed Care – PPO | Admitting: Emergency Medicine

## 2022-01-03 ENCOUNTER — Inpatient Hospital Stay (HOSPITAL_COMMUNITY)
Admission: RE | Admit: 2022-01-03 | Discharge: 2022-01-06 | DRG: 660 | Disposition: A | Discharge status: Home / Self Care | Payer: BC Managed Care – PPO | Attending: Urology | Admitting: Urology

## 2022-01-03 ENCOUNTER — Encounter (HOSPITAL_COMMUNITY)
Admission: RE | Disposition: A | Discharge status: Home / Self Care | Payer: Self-pay | Source: Home / Self Care | Attending: Urology

## 2022-01-03 ENCOUNTER — Encounter (HOSPITAL_COMMUNITY): Payer: Self-pay | Admitting: Urology

## 2022-01-03 DIAGNOSIS — Z881 Allergy status to other antibiotic agents status: Secondary | ICD-10-CM

## 2022-01-03 DIAGNOSIS — M25511 Pain in right shoulder: Secondary | ICD-10-CM | POA: Diagnosis not present

## 2022-01-03 DIAGNOSIS — Z8673 Personal history of transient ischemic attack (TIA), and cerebral infarction without residual deficits: Secondary | ICD-10-CM

## 2022-01-03 DIAGNOSIS — M1990 Unspecified osteoarthritis, unspecified site: Secondary | ICD-10-CM | POA: Diagnosis present

## 2022-01-03 DIAGNOSIS — I2510 Atherosclerotic heart disease of native coronary artery without angina pectoris: Secondary | ICD-10-CM | POA: Diagnosis present

## 2022-01-03 DIAGNOSIS — N319 Neuromuscular dysfunction of bladder, unspecified: Secondary | ICD-10-CM | POA: Diagnosis present

## 2022-01-03 DIAGNOSIS — J9811 Atelectasis: Secondary | ICD-10-CM | POA: Diagnosis not present

## 2022-01-03 DIAGNOSIS — Z87891 Personal history of nicotine dependence: Secondary | ICD-10-CM

## 2022-01-03 DIAGNOSIS — I428 Other cardiomyopathies: Secondary | ICD-10-CM | POA: Diagnosis present

## 2022-01-03 DIAGNOSIS — N2889 Other specified disorders of kidney and ureter: Principal | ICD-10-CM | POA: Diagnosis present

## 2022-01-03 DIAGNOSIS — I110 Hypertensive heart disease with heart failure: Secondary | ICD-10-CM | POA: Diagnosis present

## 2022-01-03 DIAGNOSIS — K429 Umbilical hernia without obstruction or gangrene: Secondary | ICD-10-CM | POA: Diagnosis present

## 2022-01-03 DIAGNOSIS — Z87442 Personal history of urinary calculi: Secondary | ICD-10-CM

## 2022-01-03 DIAGNOSIS — N401 Enlarged prostate with lower urinary tract symptoms: Secondary | ICD-10-CM | POA: Diagnosis present

## 2022-01-03 DIAGNOSIS — Z833 Family history of diabetes mellitus: Secondary | ICD-10-CM

## 2022-01-03 DIAGNOSIS — I5022 Chronic systolic (congestive) heart failure: Secondary | ICD-10-CM | POA: Diagnosis present

## 2022-01-03 HISTORY — PX: ROBOTIC ASSITED PARTIAL NEPHRECTOMY: SHX6087

## 2022-01-03 LAB — HEMOGLOBIN AND HEMATOCRIT, BLOOD
HCT: 42.3 % (ref 39.0–52.0)
Hemoglobin: 14 g/dL (ref 13.0–17.0)

## 2022-01-03 LAB — ABO/RH: ABO/RH(D): O POS

## 2022-01-03 SURGERY — NEPHRECTOMY, PARTIAL, ROBOT-ASSISTED
Anesthesia: General | Laterality: Right

## 2022-01-03 MED ORDER — ACETAMINOPHEN 500 MG PO TABS
1000.0000 mg | ORAL_TABLET | Freq: Once | ORAL | Status: AC
  Administered 2022-01-03: 1000 mg via ORAL
  Filled 2022-01-03: qty 2

## 2022-01-03 MED ORDER — MIDAZOLAM HCL 2 MG/2ML IJ SOLN
INTRAMUSCULAR | Status: DC | PRN
  Administered 2022-01-03: 2 mg via INTRAVENOUS

## 2022-01-03 MED ORDER — PROPOFOL 10 MG/ML IV BOLUS
INTRAVENOUS | Status: AC
  Filled 2022-01-03: qty 20

## 2022-01-03 MED ORDER — ALBUMIN HUMAN 5 % IV SOLN: INTRAVENOUS | Status: DC | PRN

## 2022-01-03 MED ORDER — FENTANYL CITRATE (PF) 250 MCG/5ML IJ SOLN
INTRAMUSCULAR | Status: DC | PRN
  Administered 2022-01-03 (×2): 50 ug via INTRAVENOUS

## 2022-01-03 MED ORDER — DOCUSATE SODIUM 100 MG PO CAPS: 100.0000 mg | ORAL_CAPSULE | Freq: Two times a day (BID) | ORAL | Status: AC

## 2022-01-03 MED ORDER — STERILE WATER FOR IRRIGATION IR SOLN
Status: DC | PRN
  Administered 2022-01-03: 1000 mL

## 2022-01-03 MED ORDER — LACTATED RINGERS IR SOLN
Status: DC | PRN
  Administered 2022-01-03: 1000 mL

## 2022-01-03 MED ORDER — BUPIVACAINE LIPOSOME 1.3 % IJ SUSP
INTRAMUSCULAR | Status: AC
  Filled 2022-01-03: qty 20

## 2022-01-03 MED ORDER — BACITRACIN-NEOMYCIN-POLYMYXIN 400-5-5000 EX OINT: 1.0000 "application " | TOPICAL_OINTMENT | Freq: Three times a day (TID) | CUTANEOUS | Status: DC | PRN

## 2022-01-03 MED ORDER — ROCURONIUM BROMIDE 10 MG/ML (PF) SYRINGE
PREFILLED_SYRINGE | INTRAVENOUS | Status: DC | PRN
  Administered 2022-01-03 (×3): 20 mg via INTRAVENOUS
  Administered 2022-01-03: 100 mg via INTRAVENOUS
  Administered 2022-01-03: 10 mg via INTRAVENOUS
  Administered 2022-01-03: 20 mg via INTRAVENOUS
  Administered 2022-01-03: 10 mg via INTRAVENOUS

## 2022-01-03 MED ORDER — LIDOCAINE 20MG/ML (2%) 15 ML SYRINGE OPTIME
INTRAMUSCULAR | Status: DC | PRN
  Administered 2022-01-03: 1.5 mg/kg/h via INTRAVENOUS

## 2022-01-03 MED ORDER — DIPHENHYDRAMINE HCL 50 MG/ML IJ SOLN
12.5000 mg | Freq: Four times a day (QID) | INTRAMUSCULAR | Status: DC | PRN
Start: 2022-01-03 — End: 2022-01-06

## 2022-01-03 MED ORDER — ONDANSETRON HCL 4 MG/2ML IJ SOLN
INTRAMUSCULAR | Status: AC
  Filled 2022-01-03: qty 2

## 2022-01-03 MED ORDER — LACTATED RINGERS IV SOLN: INTRAVENOUS | Status: DC

## 2022-01-03 MED ORDER — OXYCODONE HCL 5 MG PO TABS
5.0000 mg | ORAL_TABLET | ORAL | Status: DC | PRN
  Administered 2022-01-04 – 2022-01-06 (×7): 5 mg via ORAL
  Filled 2022-01-03 (×7): qty 1

## 2022-01-03 MED ORDER — CEFAZOLIN SODIUM-DEXTROSE 2-4 GM/100ML-% IV SOLN
2.0000 g | Freq: Once | INTRAVENOUS | Status: AC
  Administered 2022-01-03 (×2): 2 g via INTRAVENOUS
  Filled 2022-01-03: qty 100

## 2022-01-03 MED ORDER — LIDOCAINE 2% (20 MG/ML) 5 ML SYRINGE
INTRAMUSCULAR | Status: DC | PRN
  Administered 2022-01-03: 40 mg via INTRAVENOUS

## 2022-01-03 MED ORDER — DEXAMETHASONE SODIUM PHOSPHATE 10 MG/ML IJ SOLN
INTRAMUSCULAR | Status: AC
  Filled 2022-01-03: qty 1

## 2022-01-03 MED ORDER — LIDOCAINE HCL 2 % IJ SOLN
INTRAMUSCULAR | Status: AC
  Filled 2022-01-03: qty 20

## 2022-01-03 MED ORDER — PROPOFOL 10 MG/ML IV BOLUS
INTRAVENOUS | Status: DC | PRN
  Administered 2022-01-03: 150 mg via INTRAVENOUS
  Administered 2022-01-03: 50 mg via INTRAVENOUS

## 2022-01-03 MED ORDER — DOCUSATE SODIUM 100 MG PO CAPS
100.0000 mg | ORAL_CAPSULE | Freq: Two times a day (BID) | ORAL | Status: DC
  Administered 2022-01-03 – 2022-01-06 (×6): 100 mg via ORAL
  Filled 2022-01-03 (×6): qty 1

## 2022-01-03 MED ORDER — HYDROMORPHONE HCL 1 MG/ML IJ SOLN
0.5000 mg | INTRAMUSCULAR | Status: DC | PRN
  Administered 2022-01-03 – 2022-01-05 (×6): 1 mg via INTRAVENOUS
  Filled 2022-01-03 (×6): qty 1

## 2022-01-03 MED ORDER — FENTANYL CITRATE (PF) 100 MCG/2ML IJ SOLN
INTRAMUSCULAR | Status: AC
  Filled 2022-01-03: qty 2

## 2022-01-03 MED ORDER — ONDANSETRON HCL 4 MG/2ML IJ SOLN
INTRAMUSCULAR | Status: DC | PRN
  Administered 2022-01-03: 4 mg via INTRAVENOUS

## 2022-01-03 MED ORDER — KETAMINE HCL 10 MG/ML IJ SOLN
INTRAMUSCULAR | Status: DC | PRN
  Administered 2022-01-03: 20 mg via INTRAVENOUS
  Administered 2022-01-03 (×2): 10 mg via INTRAVENOUS

## 2022-01-03 MED ORDER — ALBUTEROL SULFATE HFA 108 (90 BASE) MCG/ACT IN AERS
INHALATION_SPRAY | RESPIRATORY_TRACT | Status: DC | PRN
  Administered 2022-01-03: 6 via RESPIRATORY_TRACT

## 2022-01-03 MED ORDER — ORAL CARE MOUTH RINSE: 15.0000 mL | Freq: Once | OROMUCOSAL | Status: AC

## 2022-01-03 MED ORDER — BUPIVACAINE LIPOSOME 1.3 % IJ SUSP
INTRAMUSCULAR | Status: DC | PRN
  Administered 2022-01-03: 20 mL

## 2022-01-03 MED ORDER — PROPOFOL 10 MG/ML IV BOLUS
INTRAVENOUS | Status: AC
Start: 2022-01-03 — End: ?
  Filled 2022-01-03: qty 20

## 2022-01-03 MED ORDER — PHENYLEPHRINE HCL-NACL 20-0.9 MG/250ML-% IV SOLN
INTRAVENOUS | Status: DC | PRN
  Administered 2022-01-03: 40 ug/min via INTRAVENOUS

## 2022-01-03 MED ORDER — SODIUM CHLORIDE (PF) 0.9 % IJ SOLN
INTRAMUSCULAR | Status: DC | PRN
  Administered 2022-01-03: 20 mL

## 2022-01-03 MED ORDER — CHLORHEXIDINE GLUCONATE 0.12 % MT SOLN
15.0000 mL | Freq: Once | OROMUCOSAL | Status: AC
  Administered 2022-01-03: 15 mL via OROMUCOSAL

## 2022-01-03 MED ORDER — ROCURONIUM BROMIDE 10 MG/ML (PF) SYRINGE
PREFILLED_SYRINGE | INTRAVENOUS | Status: AC
  Filled 2022-01-03: qty 10

## 2022-01-03 MED ORDER — FENTANYL CITRATE PF 50 MCG/ML IJ SOSY
PREFILLED_SYRINGE | INTRAMUSCULAR | Status: AC
  Filled 2022-01-03: qty 3

## 2022-01-03 MED ORDER — PHENYLEPHRINE 80 MCG/ML (10ML) SYRINGE FOR IV PUSH (FOR BLOOD PRESSURE SUPPORT)
PREFILLED_SYRINGE | INTRAVENOUS | Status: DC | PRN
  Administered 2022-01-03: 240 ug via INTRAVENOUS
  Administered 2022-01-03: 40 ug via INTRAVENOUS
  Administered 2022-01-03: 80 ug via INTRAVENOUS
  Administered 2022-01-03: 40 ug via INTRAVENOUS

## 2022-01-03 MED ORDER — ACETAMINOPHEN 500 MG PO TABS
1000.0000 mg | ORAL_TABLET | Freq: Four times a day (QID) | ORAL | Status: AC
  Administered 2022-01-03 – 2022-01-04 (×4): 1000 mg via ORAL
  Filled 2022-01-03 (×4): qty 2

## 2022-01-03 MED ORDER — LIDOCAINE HCL (PF) 2 % IJ SOLN
INTRAMUSCULAR | Status: AC
  Filled 2022-01-03: qty 5

## 2022-01-03 MED ORDER — SACUBITRIL-VALSARTAN 24-26 MG PO TABS
2.0000 | ORAL_TABLET | Freq: Two times a day (BID) | ORAL | Status: DC
  Administered 2022-01-03 – 2022-01-06 (×6): 2 via ORAL
  Filled 2022-01-03 (×6): qty 2

## 2022-01-03 MED ORDER — DEXAMETHASONE SODIUM PHOSPHATE 10 MG/ML IJ SOLN
INTRAMUSCULAR | Status: DC | PRN
Start: 2022-01-03 — End: 2022-01-03
  Administered 2022-01-03: 4 mg via INTRAVENOUS

## 2022-01-03 MED ORDER — CARVEDILOL 6.25 MG PO TABS
6.2500 mg | ORAL_TABLET | Freq: Two times a day (BID) | ORAL | Status: DC
  Administered 2022-01-03 – 2022-01-06 (×5): 6.25 mg via ORAL
  Filled 2022-01-03 (×6): qty 1

## 2022-01-03 MED ORDER — SUGAMMADEX SODIUM 200 MG/2ML IV SOLN
INTRAVENOUS | Status: DC | PRN
Start: 2022-01-03 — End: 2022-01-03
  Administered 2022-01-03: 200 mg via INTRAVENOUS

## 2022-01-03 MED ORDER — EPHEDRINE SULFATE-NACL 50-0.9 MG/10ML-% IV SOSY
PREFILLED_SYRINGE | INTRAVENOUS | Status: DC | PRN
  Administered 2022-01-03: 10 mg via INTRAVENOUS

## 2022-01-03 MED ORDER — ALBUTEROL SULFATE (2.5 MG/3ML) 0.083% IN NEBU
INHALATION_SOLUTION | RESPIRATORY_TRACT | Status: AC
  Filled 2022-01-03: qty 3

## 2022-01-03 MED ORDER — LACTATED RINGERS IV SOLN: INTRAVENOUS | Status: DC | PRN

## 2022-01-03 MED ORDER — SURGIFLO WITH THROMBIN (HEMOSTATIC MATRIX KIT) OPTIME
TOPICAL | Status: DC | PRN
  Administered 2022-01-03: 1

## 2022-01-03 MED ORDER — ONDANSETRON HCL 4 MG/2ML IJ SOLN: 4.0000 mg | INTRAMUSCULAR | Status: DC | PRN

## 2022-01-03 MED ORDER — CEFAZOLIN SODIUM 1 G IJ SOLR
INTRAMUSCULAR | Status: AC
  Filled 2022-01-03: qty 20

## 2022-01-03 MED ORDER — FENTANYL CITRATE PF 50 MCG/ML IJ SOSY
25.0000 ug | PREFILLED_SYRINGE | INTRAMUSCULAR | Status: DC | PRN
  Administered 2022-01-03: 50 ug via INTRAVENOUS

## 2022-01-03 MED ORDER — HYOSCYAMINE SULFATE 0.125 MG SL SUBL
0.1250 mg | SUBLINGUAL_TABLET | SUBLINGUAL | Status: DC | PRN
  Filled 2022-01-03: qty 1

## 2022-01-03 MED ORDER — DIPHENHYDRAMINE HCL 12.5 MG/5ML PO ELIX: 12.5000 mg | ORAL_SOLUTION | Freq: Four times a day (QID) | ORAL | Status: DC | PRN

## 2022-01-03 MED ORDER — ALBUTEROL SULFATE (2.5 MG/3ML) 0.083% IN NEBU
2.5000 mg | INHALATION_SOLUTION | Freq: Once | RESPIRATORY_TRACT | Status: AC
  Administered 2022-01-03: 2.5 mg via RESPIRATORY_TRACT

## 2022-01-03 MED ORDER — SODIUM CHLORIDE 0.9 % IV SOLN: INTRAVENOUS | Status: DC

## 2022-01-03 MED ORDER — SODIUM CHLORIDE (PF) 0.9 % IJ SOLN
INTRAMUSCULAR | Status: AC
  Filled 2022-01-03: qty 20

## 2022-01-03 MED ORDER — MIDAZOLAM HCL 2 MG/2ML IJ SOLN
INTRAMUSCULAR | Status: AC
  Filled 2022-01-03: qty 2

## 2022-01-03 MED ORDER — KETAMINE HCL 10 MG/ML IJ SOLN
INTRAMUSCULAR | Status: AC
  Filled 2022-01-03: qty 1

## 2022-01-03 MED ORDER — HYDROCODONE-ACETAMINOPHEN 5-325 MG PO TABS: 1.0000 | ORAL_TABLET | Freq: Four times a day (QID) | ORAL | 0 refills | Status: AC | PRN

## 2022-01-03 SURGICAL SUPPLY — 74 items
APPLICATOR SURGIFLO ENDO (HEMOSTASIS) ×1 IMPLANT
APPLICATOR VISTASEAL 35 (MISCELLANEOUS) ×2 IMPLANT
BAG COUNTER SPONGE SURGICOUNT (BAG) IMPLANT
BAG LAPAROSCOPIC 12 15 PORT 16 (BASKET) IMPLANT
BAG RETRIEVAL 10 (BASKET) ×1
BAG RETRIEVAL 12/15 (BASKET) ×2
CHLORAPREP W/TINT 26 (MISCELLANEOUS) ×2 IMPLANT
CLIP LIGATING HEM O LOK PURPLE (MISCELLANEOUS) ×2 IMPLANT
CLIP LIGATING HEMO LOK XL GOLD (MISCELLANEOUS) IMPLANT
CLIP LIGATING HEMO O LOK GREEN (MISCELLANEOUS) ×4 IMPLANT
CLIP SUT LAPRA TY ABSORB (SUTURE) ×4 IMPLANT
COVER SURGICAL LIGHT HANDLE (MISCELLANEOUS) ×2 IMPLANT
COVER TIP SHEARS 8 DVNC (MISCELLANEOUS) ×1 IMPLANT
COVER TIP SHEARS 8MM DA VINCI (MISCELLANEOUS) ×1
CUTTER ECHEON FLEX ENDO 45 340 (ENDOMECHANICALS) ×1 IMPLANT
DERMABOND ADVANCED (GAUZE/BANDAGES/DRESSINGS) ×1
DERMABOND ADVANCED .7 DNX12 (GAUZE/BANDAGES/DRESSINGS) ×1 IMPLANT
DRAIN CHANNEL 15F RND FF 3/16 (WOUND CARE) IMPLANT
DRAPE ARM DVNC X/XI (DISPOSABLE) ×4 IMPLANT
DRAPE COLUMN DVNC XI (DISPOSABLE) ×1 IMPLANT
DRAPE DA VINCI XI ARM (DISPOSABLE) ×4
DRAPE DA VINCI XI COLUMN (DISPOSABLE) ×1
DRAPE INCISE IOBAN 66X45 STRL (DRAPES) ×2 IMPLANT
DRAPE SHEET LG 3/4 BI-LAMINATE (DRAPES) ×2 IMPLANT
ELECT PENCIL ROCKER SW 15FT (MISCELLANEOUS) ×2 IMPLANT
ELECT REM PT RETURN 15FT ADLT (MISCELLANEOUS) ×2 IMPLANT
EVACUATOR SILICONE 100CC (DRAIN) IMPLANT
GLOVE BIO SURGEON STRL SZ 6.5 (GLOVE) ×2 IMPLANT
GLOVE BIOGEL PI IND STRL 7.5 (GLOVE) ×2 IMPLANT
GLOVE BIOGEL PI INDICATOR 7.5 (GLOVE) ×2
GLOVE SURG LX 7.5 STRW (GLOVE) ×2
GLOVE SURG LX STRL 7.5 STRW (GLOVE) ×2 IMPLANT
HEMOSTAT SURGICEL 4X8 (HEMOSTASIS) ×2 IMPLANT
HOLDER FOLEY CATH W/STRAP (MISCELLANEOUS) ×2 IMPLANT
IRRIG SUCT STRYKERFLOW 2 WTIP (MISCELLANEOUS) ×2
IRRIGATION SUCT STRKRFLW 2 WTP (MISCELLANEOUS) ×1 IMPLANT
KIT BASIN OR (CUSTOM PROCEDURE TRAY) ×2 IMPLANT
KIT TURNOVER KIT A (KITS) IMPLANT
LOOP VESSEL MAXI BLUE (MISCELLANEOUS) ×2 IMPLANT
MARKER SKIN DUAL TIP RULER LAB (MISCELLANEOUS) ×2 IMPLANT
NDL INSUFFLATION 14GA 120MM (NEEDLE) ×1 IMPLANT
NEEDLE INSUFFLATION 14GA 120MM (NEEDLE) ×2 IMPLANT
PROTECTOR NERVE ULNAR (MISCELLANEOUS) ×3 IMPLANT
RELOAD STAPLE 45 2.6 WHT THIN (STAPLE) IMPLANT
SEAL CANN UNIV 5-8 DVNC XI (MISCELLANEOUS) ×4 IMPLANT
SEAL XI 5MM-8MM UNIVERSAL (MISCELLANEOUS) ×4
SEALER VESSEL DA VINCI XI (MISCELLANEOUS) ×1
SEALER VESSEL EXT DVNC XI (MISCELLANEOUS) IMPLANT
SET TUBE SMOKE EVAC HIGH FLOW (TUBING) ×2 IMPLANT
SOLUTION ELECTROLUBE (MISCELLANEOUS) ×2 IMPLANT
SPIKE FLUID TRANSFER (MISCELLANEOUS) ×2 IMPLANT
STAPLE RELOAD 45 WHT (STAPLE) ×2 IMPLANT
STAPLE RELOAD 45MM WHITE (STAPLE) ×2
SURGIFLO W/THROMBIN 8M KIT (HEMOSTASIS) ×1 IMPLANT
SUT ETHILON 2 0 PSLX (SUTURE) IMPLANT
SUT MNCRL AB 4-0 PS2 18 (SUTURE) ×4 IMPLANT
SUT PDS AB 0 CT1 36 (SUTURE) ×1 IMPLANT
SUT PROLENE 4 0 RB 1 (SUTURE) ×1
SUT PROLENE 4-0 RB1 .5 CRCL 36 (SUTURE) ×1 IMPLANT
SUT VIC AB 2-0 SH 27 (SUTURE) ×6
SUT VIC AB 2-0 SH 27XBRD (SUTURE) ×6 IMPLANT
SUT VICRYL 0 UR6 27IN ABS (SUTURE) ×2 IMPLANT
SUT VLOC BARB 180 ABS3/0GR12 (SUTURE) ×4
SUTURE VLOC BRB 180 ABS3/0GR12 (SUTURE) ×2 IMPLANT
SYS BAG RETRIEVAL 10MM (BASKET) ×1
SYSTEM BAG RETRIEVAL 10MM (BASKET) ×1 IMPLANT
TOWEL OR 17X26 10 PK STRL BLUE (TOWEL DISPOSABLE) ×2 IMPLANT
TOWEL OR NON WOVEN STRL DISP B (DISPOSABLE) ×2 IMPLANT
TRAY FOLEY MTR SLVR 16FR STAT (SET/KITS/TRAYS/PACK) ×2 IMPLANT
TRAY LAPAROSCOPIC (CUSTOM PROCEDURE TRAY) ×2 IMPLANT
TROCAR BLADELESS OPT 5 100 (ENDOMECHANICALS) ×1 IMPLANT
TROCAR UNIVERSAL OPT 12M 100M (ENDOMECHANICALS) IMPLANT
TROCAR XCEL 12X100 BLDLESS (ENDOMECHANICALS) ×2 IMPLANT
WATER STERILE IRR 1000ML POUR (IV SOLUTION) ×2 IMPLANT

## 2022-01-03 NOTE — Anesthesia Procedure Notes (Signed)
Procedure Name: Intubation ?Date/Time: 01/03/2022 11:52 AM ?Performed by: Eben Burow, CRNA ?Pre-anesthesia Checklist: Patient identified, Emergency Drugs available, Suction available, Patient being monitored and Timeout performed ?Patient Re-evaluated:Patient Re-evaluated prior to induction ?Oxygen Delivery Method: Circle system utilized ?Preoxygenation: Pre-oxygenation with 100% oxygen ?Induction Type: IV induction ?Ventilation: Mask ventilation without difficulty ?Laryngoscope Size: Mac and 4 ?Grade View: Grade I ?Tube type: Oral ?Tube size: 7.5 mm ?Number of attempts: 1 ?Airway Equipment and Method: Stylet ?Placement Confirmation: ETT inserted through vocal cords under direct vision, positive ETCO2 and breath sounds checked- equal and bilateral ?Secured at: 23 cm ?Tube secured with: Tape ?Dental Injury: Teeth and Oropharynx as per pre-operative assessment  ? ? ? ? ?

## 2022-01-03 NOTE — Op Note (Signed)
Operative Note ? ?Preoperative diagnosis:  ?1.  Right renal mass ? ?Postoperative diagnosis: ?1.  Right renal mass ? ?Procedure(s): ?1.  Robotic assisted laparoscopic right radical nephrectomy (adrenal sparing) ? ?Surgeon: Rexene Alberts, MD ? ?Assistants:  Debbrah Alar, [place] ? ?An assistant was required for this surgical procedure.  The duties of the assistant included but were not limited to suctioning, passing suture, camera manipulation, retraction.  This procedure would not be able to be performed without an Environmental consultant.  ? ?Resident: Lacretia Nicks, PGY-4 ? ?Anesthesia:  General ? ?Complications:  None ? ?EBL:  220m ? ?Specimens: ?1.  ?ID Type Source Tests Collected by Time Destination  ?1 : right kidney Tissue PATH Other SURGICAL PATHOLOGY GJanith Lima MD 01/03/2022 1437   ? ? ?Drains/Catheters: ?1.  16 Fr foley catheter ? ?Intraoperative findings:   ? Right kidney encased in significant volume of fat. Intraoperative ultrasound with right renal mass which was a complex cystic and solid mass abutting the collecting system 5cm which was only a few cell layers thick. We decided against partial nephrectomy with as there was not enough parenchyma to appropriately close the defect. ?1 right renal artery that branched x3. 1 right renal vein that branches. The hilum was taken en bloc with excellent hemostais ? ?Indication:  Lucas Egelstonis a 71y.o. male with a right renal mass presenting for a robotic assisted laparoscopic right partial vs radical nephrectomy. ? ?Description of procedure: ?The indications, alternatives, benefits and risks were discussed with the patient and informed consent was obtained.  The patient was brought onto the operating room table, positioned supine and secured to the bed with a safety strap.  All pressure points were carefully padded and pneumatic compression devices were placed on the lower extremities.  After the administration of intravenous antibiotics and general endotracheal  anesthesia, a 16 French urethral catheter was inserted to drain the bladder.  The patient was repositioned in the left lateral decubitus with the right side elevated at a 70 degree angle with the left lower leg flexed and the right upper leg extended.  An axillary roll was positioned to protect the brachial plexus and a gel pad was placed to support the back.  Multiple pillows were used to pad beneath and between the lower extremities and to ensure adequate cushioning. The right arm was placed in an armrest and carefully padded. The patient was secured in place across the hips, chest and legs with foam padding and silk tape, and the table was flexed.  The patient was prepped and draped in the standard sterile manner.  The radiographic images were in the room.  Timeout was completed verifying the correct patient, surgical procedure, site and positioning, prior to beginning the procedure. ? ?Pneumoperitoneum was introduced by placing a Veress needle just lateral to the rectus belly into the abdomen and insufflating with CO2 to a pressure of 15 mmHg.  The camera trocar was placed approximately 7-1/2 cm inferior and 2 cm medially to the planned position of the right robotic arm which was approximately 2 fingerbreadths inferior to the costal margin.  The 0 degree lens was inserted under direct visualization.  The abdominal cavity was examined for any signs of injury, adhesions and identification of anatomic landmarks. We then placed our right robotic trocar, left robotic trocar and fourth arm trocar.  A 12 mm assistant port was placed periumbilically and a 5 mm assistant port was placed just inferior to the xiphoid process to use as a liver retractor.  The robot was then docked. ? ?The white line of Toldt was incised and the colon was reflected medially, exposing Gerota's fascia.  The hepatic flexure was mobilized.  This allowed for further retraction of the liver.  The duodenum was kocherized and the inferior vena cava  was visualized.  Of note, he had significant amount of fat around his kidney making this dissection extremely difficult with anterior fat draping over the IVC. The retroperitoneal fascia overlying the renal vessels was carefully separated, exposing the underlying renal vein.  I was able to achieve an adequate lift using the fourth arm to elevate the kidney, further exposing the hilum.  The right renal vein and right renal artery were carefully dissected and mobilized.  The right renal artery was seen branching x3.  The right renal vein also branch.  We elected to take the hilum en bloc given the significant branching.  The right renal artery and right renal vein were taken en bloc below the level of branching.  A laparoscopic battery-operated stapler with a vascular load was used to control and ligate the right renal artery and renal vein.  Next, the vessel sealer was used to mobilize the upper pole and any remaining attachments to the right kidney.  Of note, due to his significant mass effect, this is also very difficult and required much longer operative time. Special attention was given to the cranial connections to the adrenal gland, which were carefully divided using the vessel sealer, completely freeing the adrenal off the superior pole of the kidney.  The right ureter was divided using the stapler.  ? ?The kidney was placed in an Endo Catch bag however it would not entirely fit.  The renal fossa and remainder of the operating field were inspected for bleeding or injury.  The insufflation pressure was reduced, again confirming the absence of bleeding.  Excellent hemostasis was obtained. ? ?A Gibson incision was made at the site of the fourth arm trocar.  The fascia was opened and the specimen was removed.  We did have to split the muscle some in order to accommodate the large kidney.  The ports were removed under direct vision.  Carter-Kerins device was used to close the 12 mm port.  All wounds were  copiously irrigated and then infiltrated with Exparel.  The muscle was reapproximated using 0 Vicryl as a first layer. 1-0 PDS was used to close the fascia as a second layer.  The skin incisions were reapproximated with 4-0 Monocryl.  Dermabond was then applied on the skin.  At the end of the procedure, all counts were correct.  The patient tolerated the procedure well and was taken to the recovery room in satisfactory condition.  I discussed operative findings with his wife. ? ?Lucas R. Ladonte Verstraete MD ?[facility]  ?Pager: [phone] ? ?

## 2022-01-03 NOTE — Progress Notes (Signed)
Pt ambulated on hall way at this time via walker, tolerated well.  ?

## 2022-01-03 NOTE — Progress Notes (Signed)
The patient arrived to the unit. He is alert, oriented x4. Pt is complaining of pain on right abdomen and back 8/10.  abdominal ports sites lateral right incison x1. 7 sites total. Bilateral buttocks are red, blanchable. Foley is draining to gravity. Oxygen saturation 92 % 3 lt. Will increase to 4 lt. Will continue to monitor.  ?

## 2022-01-03 NOTE — Transfer of Care (Signed)
Immediate Anesthesia Transfer of Care Note ? ?Patient: Lucas Brock ? ?Procedure(s) Performed: XI ROBOTIC ASSITED PARTIAL NEPHRECTOMY, POSSIBLE RADICAL NEPHRECTOMY (Right) ? ?Patient Location: PACU ? ?Anesthesia Type:General ? ?Level of Consciousness: awake and alert  ? ?Airway & Oxygen Therapy: Patient Spontanous Breathing and Patient connected to face mask oxygen ? ?Post-op Assessment: Report given to RN and Post -op Vital signs reviewed and stable ? ?Post vital signs: Reviewed and stable ? ?Last Vitals:  ?Vitals Value Taken Time  ?BP 147/101 01/03/22 1720  ?Temp    ?Pulse 77 01/03/22 1723  ?Resp 22 01/03/22 1723  ?SpO2 97 % 01/03/22 1723  ?Vitals shown include unvalidated device data. ? ?Last Pain:  ?Vitals:  ? 01/03/22 1053  ?TempSrc:   ?PainSc: 0-No pain  ?   ? ?  ? ?Complications: No notable events documented. ?

## 2022-01-03 NOTE — H&P (Signed)
Office Visit Report     12/31/2021  ? ?-------------------------------------------------------------------------------- ?  ?Lucas Brock  ?MRN: [id number]  ?DOB: 1951/02/16, 71 year old Male  ?SSN:   ? PRIMARY CARE:    ?REFERRING:  Janith Lima, MD  ?PROVIDER:  Rexene Alberts, M.D.  ?TREATING:  Jiles Crocker, NP  ?LOCATION:  [facility] [phone]  ?  ? ?-------------------------------------------------------------------------------- ?  ?CC/HPI: Lucas Brock is a 71 year old male who is seen in followup today for a right renal mass, left adrenal cyst, history of urolithiasis, neurogenic bladder and gross hematuria.  ? ?He is being referred by [facility] urology in [place], [place] by Dr. Edwin Dada, MD.  ? ?1. Neurogenic bladder: Patient states that he had a history of BPH/LUTS and underwent a TURP many years ago. At that time, he states he had urodynamics that demonstrated an areflexive bladder. He states that he is currently treated with clean intermittent catheterization 5 times a day. He denies incontinence in between catheterizations. He denies recurrent bothersome urinary tract infections. As a part of the screening protocol for neurogenic bladder, he underwent routine renal ultrasound 08/02/2021 that demonstrated an indeterminate right superior pole renal mass.  ? ?2. Right renal mass:  ?-Renal ultrasound 08/02/2021 performed for history of neurogenic bladder demonstrated an indeterminate right superior pole renal mass.  ?-CT A/P with and without contrast 09/03/2021 demonstrated a hypoenhancing (12 HU) solid-appearing right renal mass suspicious for malignancy measuring approximately 5 cm. Of note, there is also thinned renal parenchyma with the mass abutting a large segment of the collecting system.  ?-CT hematuria protocol 10/04/2021 demonstrating 4.7 cm subcapsular lesion in the right kidney showing equivocal signs of contrast-enhancement. This could represent a hemorrhagic  cyst although a solid mass cannot be definitively excluded.  ?-MRI abdomen with and without contrast 11/10/2021 with complex cystic and solid exophytic mass at the lateral right kidney that should be considered renal cell carcinoma until proven otherwise. This measures 4.7 x 4 x 5.5 cm. Of note, there is very thin parenchyma between the mass and the hydronephrotic collecting system. 1 artery, one vein.  ?-He does complain of intermittent right flank discomfort.  ?-He does have a history of gross hematuria over the past couple of months.  ? ?3. History of urolithiasis: Patient states that he has a history of urolithiasis and underwent a right PCNL in the past. He has no current stones on recent CT A/P.  ? ?#4. Gross hematuria:  ?-Likely originating from hypervascular prostate. Cystoscopy 11/30/2021 with wide caliber bulbar urethral stricture, bilobar obstructing prostate with hypervascularity identified. No suspicious bladder lesion.  ?He does report history of gross hematuria over the past couple of months. He does have a smoking history smoking approximately 10 years 1 pack/day. He denies a family history of urologic malignancy.  ? ?#5. Bilateral renal cysts: CT A/P 10/04/2021 with benign Bosniak category 1 and 2 bilateral renal cysts.  ? ?#6. Left adrenal cyst: CT A/P 10/04/2021 with benign appearing cyst in the left adrenal gland.  ? ?Patient currently denies fever, chills, sweats, nausea, vomiting, abdominal or flank pain, gross hematuria or dysuria.  ? ?He denies a history of cardiac or pulmonary disease. He does have a history of hypertension. He denies a history of prior abdominal surgeries. He denies taking anticoagulation.  ? ?12/31/2021: Here today for preoperative appointment prior to undergoing right robotic assisted laparoscopic partial nephrectomy with possibility of undergoing radical nephrectomy pending intraoperative findings on 4/24 with his urologist. Overall doing well. He  continues CIC 5 times daily  with no new or worsening difficulty inserting catheter and completely draining the bladder. He denies any interval hematuria, painful or leaking urgency, burning or pain with catheter insertion/removal. He denies any changes in past medical history, prescription medications taken elevated basis, no interval surgical or procedural intervention, no interval infection treatment. He denies any chest pain, shortness of breath, nausea/vomiting or recent fever/chills.  ? ?  ?ALLERGIES: Bactrim ?Ciprofloxacin ?  ? ?MEDICATIONS: Carvedilol 6.25 mg tablet  ?Entresto 24 mg-26 mg tablet  ?Prednisone  ?  ? ?GU PSH: Cystoscopy - 11/30/2021 ?Cystoscopy TURP ?Locm 300-'399Mg'$ /Ml Iodine,1Ml - 10/04/2021 ? ?  ? ?NON-GU PSH: Knee Arthroscopy, Bilateral ?Tonsillectomy ? ?  ? ?GU PMH: Bladder, Neuromuscular dysfunction, Unspec - 11/30/2021, - 11/01/2021, - 10/04/2021 ?History of urolithiasis - 11/30/2021, - 10/04/2021 ?Renal cyst - 11/30/2021, - 10/04/2021 ?Right renal neoplasm - 11/30/2021 ?Gross hematuria - 11/01/2021, - 10/04/2021 ?Right uncertain neoplasm of kidney - 11/01/2021, - 10/04/2021 ?  ? ?NON-GU PMH: Other specified disorders of adrenal gland - 10/04/2021 ?Arthritis ?Hypertension ?Stroke/TIA ?  ? ?FAMILY HISTORY: 1 Daughter - Runs in Family ?2 sons - Runs in Family ?Diabetes - Runs in Family  ? ?SOCIAL HISTORY: Marital Status: Widowed ?Preferred Language: [place]; Ethnicity: Not Hispanic Or Latino; Race: White ?Current Smoking Status: Patient does not smoke anymore. Has not smoked since 09/12/1981. Smoked for 10 years.  ? ?Tobacco Use Assessment Completed: Used Tobacco in last 30 days? ?Does not use smokeless tobacco. ?Does drink.  ?Does not use drugs. ?Drinks 1 caffeinated drink per day. ?Has not had a blood transfusion. ?  ? ?REVIEW OF SYSTEMS:    ?GU Review Male:   Patient denies frequent urination, hard to postpone urination, burning/ pain with urination, get up at night to urinate, leakage of urine, stream starts and stops, trouble  starting your stream, have to strain to urinate , erection problems, and penile pain.  ?Gastrointestinal (Upper):   Patient denies nausea, vomiting, and indigestion/ heartburn.  ?Gastrointestinal (Lower):   Patient denies diarrhea and constipation.  ?Constitutional:   Patient denies fever, night sweats, weight loss, and fatigue.  ?Skin:   Patient denies skin rash/ lesion and itching.  ?Eyes:   Patient denies blurred vision and double vision.  ?Ears/ Nose/ Throat:   Patient denies sore throat and sinus problems.  ?Hematologic/Lymphatic:   Patient denies swollen glands and easy bruising.  ?Cardiovascular:   Patient denies leg swelling and chest pains.  ?Respiratory:   Patient denies cough and shortness of breath.  ?Endocrine:   Patient denies excessive thirst.  ?Musculoskeletal:   Patient denies back pain and joint pain.  ?Neurological:   Patient denies dizziness and headaches.  ?Psychologic:   Patient denies depression and anxiety.  ? ?VITAL SIGNS:    ?  12/31/2021 03:00 PM  ?Weight 230 lb / 104.33 kg  ?Height 68 in / 172.72 cm  ?BP 164/102 mmHg  ?Heart Rate 68 /min  ?Temperature 98.0 F / 36.6 C  ?BMI 35.0 kg/m?  ? ?MULTI-SYSTEM PHYSICAL EXAMINATION:    ?Constitutional: Well-nourished. No physical deformities. Normally developed. Good grooming.  ?Neck: Neck symmetrical, not swollen. Normal tracheal position.  ?Respiratory: No labored breathing, no use of accessory muscles.   ?Cardiovascular: Normal temperature, normal extremity pulses, no swelling, no varicosities.  ?Skin: No paleness, no jaundice, no cyanosis. No lesion, no ulcer, no rash.  ?Neurologic / Psychiatric: Oriented to time, oriented to place, oriented to person. No depression, no anxiety, no agitation.  ?Gastrointestinal: Obese  abdomen. Umbilical hernia. No mass, no tenderness, no rigidity.   ?Musculoskeletal: Normal gait and station of head and neck.  ? ?  ?Complexity of Data:  ?Source Of History:  Patient, Medical Record Summary  ?Records Review:    Previous Doctor Records, Previous Hospital Records, Previous Patient Records  ?Urine Test Review:   Urinalysis, Urine Culture  ?X-Ray Review: C.T. Abdomen/Pelvis: Reviewed Films. Reviewed Report.  ?MRI Abdomen:

## 2022-01-03 NOTE — Discharge Instructions (Signed)

## 2022-01-03 NOTE — Anesthesia Postprocedure Evaluation (Signed)
Anesthesia Post Note ? ?Patient: Casmere Hollenbeck ? ?Procedure(s) Performed: XI ROBOTIC ASSITED PARTIAL NEPHRECTOMY, POSSIBLE RADICAL NEPHRECTOMY (Right) ? ?  ? ?Patient location during evaluation: PACU ?Anesthesia Type: General ?Level of consciousness: awake and alert ?Pain management: pain level controlled ?Vital Signs Assessment: post-procedure vital signs reviewed and stable ?Respiratory status: spontaneous breathing, nonlabored ventilation, respiratory function stable and patient connected to nasal cannula oxygen ?Cardiovascular status: blood pressure returned to baseline and stable ?Postop Assessment: no apparent nausea or vomiting ?Anesthetic complications: no ? ? ?No notable events documented. ? ?Last Vitals:  ?Vitals:  ? 01/03/22 1745 01/03/22 1800  ?BP: 121/77 115/85  ?Pulse: 71 65  ?Resp: 17 17  ?Temp:    ?SpO2: 94% 92%  ?  ?Last Pain:  ?Vitals:  ? 01/03/22 1800  ?TempSrc:   ?PainSc: Asleep  ? ? ?  ?  ?  ?  ?  ?  ? ?Kylen Schliep ? ? ? ? ?

## 2022-01-04 ENCOUNTER — Other Ambulatory Visit: Payer: Self-pay

## 2022-01-04 ENCOUNTER — Encounter (HOSPITAL_COMMUNITY): Payer: Self-pay | Admitting: Urology

## 2022-01-04 DIAGNOSIS — N319 Neuromuscular dysfunction of bladder, unspecified: Secondary | ICD-10-CM | POA: Diagnosis present

## 2022-01-04 DIAGNOSIS — Z833 Family history of diabetes mellitus: Secondary | ICD-10-CM | POA: Diagnosis not present

## 2022-01-04 DIAGNOSIS — Z87442 Personal history of urinary calculi: Secondary | ICD-10-CM | POA: Diagnosis not present

## 2022-01-04 DIAGNOSIS — I2510 Atherosclerotic heart disease of native coronary artery without angina pectoris: Secondary | ICD-10-CM | POA: Diagnosis present

## 2022-01-04 DIAGNOSIS — J9811 Atelectasis: Secondary | ICD-10-CM | POA: Diagnosis not present

## 2022-01-04 DIAGNOSIS — I110 Hypertensive heart disease with heart failure: Secondary | ICD-10-CM | POA: Diagnosis present

## 2022-01-04 DIAGNOSIS — I5022 Chronic systolic (congestive) heart failure: Secondary | ICD-10-CM | POA: Diagnosis present

## 2022-01-04 DIAGNOSIS — M25511 Pain in right shoulder: Secondary | ICD-10-CM | POA: Diagnosis not present

## 2022-01-04 DIAGNOSIS — Z881 Allergy status to other antibiotic agents status: Secondary | ICD-10-CM | POA: Diagnosis not present

## 2022-01-04 DIAGNOSIS — I428 Other cardiomyopathies: Secondary | ICD-10-CM | POA: Diagnosis present

## 2022-01-04 DIAGNOSIS — Z8673 Personal history of transient ischemic attack (TIA), and cerebral infarction without residual deficits: Secondary | ICD-10-CM | POA: Diagnosis not present

## 2022-01-04 DIAGNOSIS — M1990 Unspecified osteoarthritis, unspecified site: Secondary | ICD-10-CM | POA: Diagnosis present

## 2022-01-04 DIAGNOSIS — N401 Enlarged prostate with lower urinary tract symptoms: Secondary | ICD-10-CM | POA: Diagnosis present

## 2022-01-04 DIAGNOSIS — N2889 Other specified disorders of kidney and ureter: Secondary | ICD-10-CM | POA: Diagnosis present

## 2022-01-04 DIAGNOSIS — K429 Umbilical hernia without obstruction or gangrene: Secondary | ICD-10-CM | POA: Diagnosis present

## 2022-01-04 DIAGNOSIS — Z87891 Personal history of nicotine dependence: Secondary | ICD-10-CM | POA: Diagnosis not present

## 2022-01-04 LAB — BASIC METABOLIC PANEL
Anion gap: 8 (ref 5–15)
BUN: 21 mg/dL (ref 8–23)
CO2: 23 mmol/L (ref 22–32)
Calcium: 8.2 mg/dL — ABNORMAL LOW (ref 8.9–10.3)
Chloride: 103 mmol/L (ref 98–111)
Creatinine, Ser: 1.54 mg/dL — ABNORMAL HIGH (ref 0.61–1.24)
GFR, Estimated: 49 mL/min — ABNORMAL LOW (ref >60)
Glucose, Bld: 126 mg/dL — ABNORMAL HIGH (ref 70–99)
Potassium: 4.5 mmol/L (ref 3.5–5.1)
Sodium: 134 mmol/L — ABNORMAL LOW (ref 135–145)

## 2022-01-04 LAB — HEMOGLOBIN AND HEMATOCRIT, BLOOD
HCT: 39.1 % (ref 39.0–52.0)
Hemoglobin: 12.9 g/dL — ABNORMAL LOW (ref 13.0–17.0)

## 2022-01-04 MED ORDER — CHLORHEXIDINE GLUCONATE CLOTH 2 % EX PADS
6.0000 | MEDICATED_PAD | Freq: Every day | CUTANEOUS | Status: DC
  Administered 2022-01-04 – 2022-01-06 (×3): 6 via TOPICAL

## 2022-01-04 NOTE — Progress Notes (Signed)
Pt ambulated on hall way this morning via walker, tolerated well. ?

## 2022-01-04 NOTE — Progress Notes (Signed)
1 Day Post-Op ?Subjective: ?C/o typical post-op abdominal pain and does have some right shoulder pain. No nausea or emesis. No flatus or BM. Tolerating clears. ? ?Objective: ?Vital signs in last 24 hours: ?Temp:  [97.6 ?F (36.4 ?C)-98.8 ?F (37.1 ?C)] 98.7 ?F (37.1 ?C) (04/25 7619) ?Pulse Rate:  [57-77] 65 (04/25 0629) ?Resp:  [14-20] 20 (04/25 5093) ?BP: (100-144)/(69-104) 101/69 (04/25 2671) ?SpO2:  [90 %-100 %] 90 % (04/25 0629) ?Weight:  [102.9 kg-103 kg] 102.9 kg (04/24 1846) ? ?Intake/Output from previous day: ?04/24 0701 - 04/25 0700 ?In: 4459.1 [I.V.:4199.1; IV Piggyback:260] ?Out: 1300 [Urine:750; Blood:550] ?Intake/Output this shift: ?No intake/output data recorded. ? ?UOP: 743m clear ? ?Physical Exam:  ?General: Alert and oriented ?CV: RRR ?Lungs: Clear ?Abdomen: Soft, ND, ATTP; inc c/d/I ?Ext: NT, No erythema ? ?Lab Results: ?Recent Labs  ?  01/03/22 ?1816 01/04/22 ?0445  ?HGB 14.0 12.9*  ?HCT 42.3 39.1  ? ?BMET ?Recent Labs  ?  01/04/22 ?0445  ?NA 134*  ?K 4.5  ?CL 103  ?CO2 23  ?GLUCOSE 126*  ?BUN 21  ?CREATININE 1.54*  ?CALCIUM 8.2*  ? ? ? ?Studies/Results: ?No results found. ? 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 ?-Pain control prn ?-Will have PT evaluate right shoulder, discussed likely due to positioning and will improve with time ?-Clears, advance as tolerated ?-Adequate UOP. Trend creatinine another day. ?-Keep foley as he has a neurogenic bladder managed with CIC. Will remove foley prior to discharge ?-OOB, amb, IS, SQH ?-Anticipate discharge home tomorrow ? ? LOS: 0 days  ? ?Matt R. Valori Hollenkamp MD ?01/04/2022, 7:39 AM ?[facility]  ?Pager: [phone] ? ? ? ?

## 2022-01-04 NOTE — Evaluation (Addendum)
Physical Therapy Evaluation ?Patient Details ?Name: Lucas Brock ?MRN: [id number] ?DOB: Dec 23, 1950 ?Today's Date: 01/04/2022 ? ?History of Present Illness ? 71 y.o. male admitted 01/03/22 for Robotic assisted laparoscopic right radical nephrectomy. Post op R shoulder pain. PMH includes: CAD, HTN, LBBB.  ?Clinical Impression ? Pt admitted with above diagnosis. Pt ambulated 28' with RW, no loss of balance. Distance limited by R shoulder pain. Encouraged gentle ROM of R shoulder. Pt reports his RUE was in an elevated position for 4 hours during surgery, he stated this pain is more bothersome than nephrectomy incision site.  Applied hot pack to R upper back, pt reported some relief of pain with this. He would benefit from a K pad, texted MD to request order.  Pt currently with functional limitations due to the deficits listed below (see PT Problem List). Pt will benefit from skilled PT to increase their independence and safety with mobility to allow discharge to the venue listed below.   ?   ?   ? ?Recommendations for follow up therapy are one component of a multi-disciplinary discharge planning process, led by the attending physician.  Recommendations may be updated based on patient status, additional functional criteria and insurance authorization. ? ?Follow Up Recommendations No PT follow up ? ?  ?Assistance Recommended at Discharge Set up Supervision/Assistance  ?Patient can return home with the following ? Assist for transportation;Assistance with cooking/housework ? ?  ?Equipment Recommendations Rolling walker (2 wheels)  ?Recommendations for Other Services ?    ?  ?Functional Status Assessment Patient has had a recent decline in their functional status and demonstrates the ability to make significant improvements in function in a reasonable and predictable amount of time.  ? ?  ?Precautions / Restrictions Precautions ?Precautions: Other (comment) ?Precaution Comments: abdominal incision ?Restrictions ?Weight  Bearing Restrictions: No  ? ?  ? ?Mobility ? Bed Mobility ?  ?  ?  ?  ?  ?  ?  ?General bed mobility comments: up at edge of bed ?  ? ?Transfers ?Overall transfer level: Modified independent ?Equipment used: Rolling walker (2 wheels) ?  ?  ?  ?  ?  ?  ?  ?  ?  ? ?Ambulation/Gait ?Ambulation/Gait assistance: Modified independent (Device/Increase time) ?Gait Distance (Feet): 70 Feet ?Assistive device: Rolling walker (2 wheels) ?Gait Pattern/deviations: Step-through pattern, Decreased stride length ?Gait velocity: decr ?  ?  ?General Gait Details: steady with RW, distance limited by pain ? ?Stairs ?  ?  ?  ?  ?  ? ?Wheelchair Mobility ?  ? ?Modified Rankin (Stroke Patients Only) ?  ? ?  ? ?Balance Overall balance assessment: Modified Independent ?  ?  ?  ?  ?  ?  ?  ?  ?  ?  ?  ?  ?  ?  ?  ?  ?  ?  ?   ? ? ? ?Pertinent Vitals/Pain Pain Assessment ?Pain Assessment: 0-10 ?Pain Score: 7  ?Pain Location: R upper trapezius area ?Pain Descriptors / Indicators: Aching, Grimacing ?Pain Intervention(s): Limited activity within patient's tolerance, Monitored during session, Premedicated before session, Heat applied  ? ? ?Home Living Family/patient expects to be discharged to:: Private residence ?Living Arrangements: Spouse/significant other ?  ?  ?Home Access: Ramped entrance ?  ?  ?  ?  ?Home Equipment: None ?   ?  ?Prior Function Prior Level of Function : Independent/Modified Independent ?  ?  ?  ?  ?  ?  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  ?  ? ? ?Hand Dominance  ?   ? ?  ?Extremity/Trunk Assessment  ? Upper Extremity Assessment ?Upper Extremity Assessment: RUE deficits/detail ?RUE Deficits / Details: able to elevate shoulder actively to ~130* ?RUE: Shoulder pain with ROM ?RUE Sensation: WNL ?RUE Coordination: WNL ?  ? ?Lower Extremity Assessment ?Lower Extremity Assessment: Overall WFL for tasks assessed ?  ? ?Cervical / Trunk Assessment ?Cervical / Trunk Assessment: Normal (cervical AROM WNL)   ?Communication  ? Communication: No difficulties  ?Cognition Arousal/Alertness: Awake/alert ?Behavior During Therapy: [facility] for tasks assessed/performed ?Overall Cognitive Status: Within Functional Limits for tasks assessed ?  ?  ?  ?  ?  ?  ?  ?  ?  ?  ?  ?  ?  ?  ?  ?  ?  ?  ?  ? ?  ?General Comments   ? ?  ?Exercises Other Exercises ?Other Exercises: shoulder rolls x 5 AROM seated both  ? ?Assessment/Plan  ?  ?PT Assessment Patient needs continued PT services  ?PT Problem List Decreased activity tolerance;Decreased range of motion;Decreased mobility;Pain ? ?   ?  ?PT Treatment Interventions Therapeutic activities;Therapeutic exercise;Gait training   ? ?PT Goals (Current goals can be found in the Care Plan section)  ?Acute Rehab PT Goals ?Patient Stated Goal: return to work, decrease shoulder pain ?PT Goal Formulation: With patient ?Time For Goal Achievement: 01/18/22 ?Potential to Achieve Goals: Good ? ?  ?Frequency   ?  ? ? ?Co-evaluation   ?  ?  ?  ?  ? ? ?  ?AM-PAC PT "6 Clicks" Mobility  ?Outcome Measure Help needed turning from your back to your side while in a flat bed without using bedrails?: None ?Help needed moving from lying on your back to sitting on the side of a flat bed without using bedrails?: A Little ?Help needed moving to and from a bed to a chair (including a wheelchair)?: None ?Help needed standing up from a chair using your arms (e.g., wheelchair or bedside chair)?: A Little ?Help needed to walk in hospital room?: None ?Help needed climbing 3-5 steps with a railing? : None ?6 Click Score: 22 ? ?  ?End of Session   ?Activity Tolerance: Patient limited by pain ?Patient left: with call bell/phone within reach;with chair alarm set;in chair ?Nurse Communication: Mobility status ?PT Visit Diagnosis: Pain;Difficulty in walking, not elsewhere classified (R26.2) ?Pain - Right/Left: Right ?Pain - part of body: Shoulder ?  ? ?Time: 9242-6834 ?PT Time Calculation (min) (ACUTE ONLY): 20 min ? ? ?Charges:    PT Evaluation ?$PT Eval Low Complexity: 1 Low ?  ?  ?   ? ?Blondell Reveal Kistler PT 01/04/2022  ?Acute Rehabilitation Services ?Pager [phone] ?Office [phone] ? ? ?

## 2022-01-05 ENCOUNTER — Inpatient Hospital Stay (HOSPITAL_COMMUNITY): Payer: BC Managed Care – PPO

## 2022-01-05 LAB — CBC WITH DIFFERENTIAL/PLATELET
Abs Immature Granulocytes: 0.08 10*3/uL — ABNORMAL HIGH (ref 0.00–0.07)
Basophils Absolute: 0 10*3/uL (ref 0.0–0.1)
Basophils Relative: 0 %
Eosinophils Absolute: 0 10*3/uL (ref 0.0–0.5)
Eosinophils Relative: 0 %
HCT: 39.2 % (ref 39.0–52.0)
Hemoglobin: 12.7 g/dL — ABNORMAL LOW (ref 13.0–17.0)
Immature Granulocytes: 1 %
Lymphocytes Relative: 12 %
Lymphs Abs: 1.2 10*3/uL (ref 0.7–4.0)
MCH: 29.9 pg (ref 26.0–34.0)
MCHC: 32.4 g/dL (ref 30.0–36.0)
MCV: 92.2 fL (ref 80.0–100.0)
Monocytes Absolute: 1.2 10*3/uL — ABNORMAL HIGH (ref 0.1–1.0)
Monocytes Relative: 12 %
Neutro Abs: 8.2 10*3/uL — ABNORMAL HIGH (ref 1.7–7.7)
Neutrophils Relative %: 75 %
Platelets: 165 10*3/uL (ref 150–400)
RBC: 4.25 MIL/uL (ref 4.22–5.81)
RDW: 12.6 % (ref 11.5–15.5)
WBC: 10.8 10*3/uL — ABNORMAL HIGH (ref 4.0–10.5)
nRBC: 0 % (ref 0.0–0.2)

## 2022-01-05 LAB — BASIC METABOLIC PANEL
Anion gap: 6 (ref 5–15)
BUN: 21 mg/dL (ref 8–23)
CO2: 25 mmol/L (ref 22–32)
Calcium: 8.7 mg/dL — ABNORMAL LOW (ref 8.9–10.3)
Chloride: 109 mmol/L (ref 98–111)
Creatinine, Ser: 1.52 mg/dL — ABNORMAL HIGH (ref 0.61–1.24)
GFR, Estimated: 49 mL/min — ABNORMAL LOW (ref >60)
Glucose, Bld: 118 mg/dL — ABNORMAL HIGH (ref 70–99)
Potassium: 3.8 mmol/L (ref 3.5–5.1)
Sodium: 140 mmol/L (ref 135–145)

## 2022-01-05 NOTE — Progress Notes (Addendum)
2 Days Post-Op ?Subjective: ?Right should pain improved. ?Right lower incisional pain worse ?Tolerating regular diet ?Passing flatus ?Ambulating with assistance  ? ?Objective: ?Vital signs in last 24 hours: ?Temp:  [98.2 ?F (36.8 ?C)-100.1 ?F (37.8 ?C)] 99.7 ?F (37.6 ?C) (04/26 0559) ?Pulse Rate:  [72-92] 92 (04/26 0559) ?Resp:  [16-19] 18 (04/26 0559) ?BP: (95-140)/(58-85) 131/85 (04/26 0559) ?SpO2:  [91 %-97 %] 91 % (04/26 0559) ? ?Intake/Output from previous day: ?04/25 0701 - 04/26 0700 ?In: -  ?Out: 250 [Urine:250] ?Intake/Output this shift: ?No intake/output data recorded. ? ?UOP: 250cc recorded + ~500cc in foley bag this am.  ? ?Physical Exam:  ?General: Alert and oriented ?CV: RRR ?Lungs: Clear ?Abdomen: Soft, ND, ATTP; inc c/d/I ?Ext: NT, No erythema ? ?Lab Results: ?Recent Labs  ?  01/03/22 ?1816 01/04/22 ?0445 01/05/22 ?0454  ?HGB 14.0 12.9* 12.7*  ?HCT 42.3 39.1 39.2  ? ?BMET ?Recent Labs  ?  01/04/22 ?0445 01/05/22 ?0454  ?NA 134* 140  ?K 4.5 3.8  ?CL 103 109  ?CO2 23 25  ?GLUCOSE 126* 118*  ?BUN 21 21  ?CREATININE 1.54* 1.52*  ?CALCIUM 8.2* 8.7*  ? ? ? ?Studies/Results: ?No results found. ? 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 ?-Work on pain control this morning  ?-Regular diet ?-Creatinine stable at 1.5; ~800cc UOP over last 24 hours.  ?-Will remove foley prior to discharge and resume home CIC regimen ?- Possible discharge home today versus tomorrow pending pain control  ? ? LOS: 1 day  ? ?I have seen and examined the patient and agree with the above assessment and plan. ? ?Matt R. Carlean Crowl MD ?[facility]  ?Pager: [phone] ? ? ? ?

## 2022-01-05 NOTE — Plan of Care (Signed)
?  Problem: Bowel/Gastric: ?Goal: Gastrointestinal status for postoperative course will improve ?Outcome: Progressing ?  ?Problem: Clinical Measurements: ?Goal: Postoperative complications will be avoided or minimized ?Outcome: Progressing ?  ?

## 2022-01-05 NOTE — Evaluation (Signed)
Occupational Therapy Evaluation ?Patient Details ?Name: Lucas Brock ?MRN: [id number] ?DOB: [birth year] ?Today's Date: 01/05/2022 ? ? ?History of Present Illness 71 y.o. male admitted 01/03/22 for Robotic assisted laparoscopic right radical nephrectomy. Post op R shoulder pain. PMH includes: CAD, HTN, LBBB.  ? ?Clinical Impression ?  ?Lucas Brock is s/p ex  lap and presents with pain and decreased activity tolerance. Today he reports his shoulder pain has resolved. Patient refuses mobility after attempting log roll to get out of bed due to pain. Today he requires significant assistance with ADLs at bed level due to pain but expect patient should recover well. Patient will benefit from skilled OT services while in hospital to improve deficits and learn compensatory strategies as needed in order to return to PLOF.     ?   ? ?Recommendations for follow up therapy are one component of a multi-disciplinary discharge planning process, led by the attending physician.  Recommendations may be updated based on patient status, additional functional criteria and insurance authorization.  ? ?Follow Up Recommendations ? No OT follow up  ?  ?Assistance Recommended at Discharge Intermittent Supervision/Assistance  ?Patient can return home with the following A little help with bathing/dressing/bathroom;Assistance with cooking/housework ? ?  ?Functional Status Assessment ? Patient has had a recent decline in their functional status and demonstrates the ability to make significant improvements in function in a reasonable and predictable amount of time.  ?Equipment Recommendations ? Other (comment) (TBD)  ?  ?Recommendations for Other Services   ? ? ?  ?Precautions / Restrictions Precautions ?Precaution Comments: abdominal incision ?Restrictions ?Weight Bearing Restrictions: No  ? ?  ? ?Mobility Bed Mobility ?  ?  ?  ?  ?  ?  ?  ?  ?  ? ?Transfers ?  ?  ?  ?  ?  ?  ?  ?  ?  ?  ?  ? ?  ?Balance   ?  ?  ?  ?  ?  ?  ?  ?  ?  ?  ?   ?  ?  ?  ?  ?  ?  ?  ?   ? ?ADL either performed or assessed with clinical judgement  ? ?ADL Overall ADL's : Needs assistance/impaired ?Eating/Feeding: Set up;Bed level ?  ?Grooming: Set up;Bed level ?  ?Upper Body Bathing: Set up;Bed level ?  ?Lower Body Bathing: Maximal assistance;Bed level ?  ?Upper Body Dressing : Set up;Bed level ?  ?Lower Body Dressing: Total assistance;Bed level ?  ?  ?  ?Toileting- Clothing Manipulation and Hygiene: Total assistance ?  ?  ?  ?  ?   ? ? ? ?Vision   ?Vision Assessment?: No apparent visual deficits  ?   ?Perception   ?  ?Praxis   ?  ? ?Pertinent Vitals/Pain Pain Assessment ?Pain Assessment: 0-10 ?Pain Score: 10-Worst pain ever ?Pain Location: incision/abdomen with movement/coughing ?Pain Descriptors / Indicators: Grimacing, Guarding, Sharp, Shooting ?Pain Intervention(s): Limited activity within patient's tolerance, Premedicated before session  ? ? ? ?Hand Dominance Right ?  ?Extremity/Trunk Assessment Upper Extremity Assessment ?Upper Extremity Assessment: Overall WFL for tasks assessed ?  ?Lower Extremity Assessment ?Lower Extremity Assessment: Defer to PT evaluation ?  ?Cervical / Trunk Assessment ?Cervical / Trunk Assessment: Normal ?  ?Communication Communication ?Communication: No difficulties ?  ?Cognition Arousal/Alertness: Awake/alert ?Behavior During Therapy: [facility] for tasks assessed/performed ?Overall Cognitive Status: Within Functional Limits for tasks assessed ?  ?  ?  ?  ?  ?  ?  ?  ?  ?  ?  ?  ?  ?  ?  ?  ?  ?  ?  ?  General Comments    ? ?  ?Exercises   ?  ?Shoulder Instructions    ? ? ?Home Living Family/patient expects to be discharged to:: Private residence ?Living Arrangements: Spouse/significant other ?  ?  ?Home Access: Ramped entrance ?  ?  ?Home Layout: One level ?  ?  ?Bathroom Shower/Tub: Tub/shower unit ?  ?Bathroom Toilet: Standard ?  ?  ?Home Equipment: None ?  ?  ?  ? ?  ?Prior Functioning/Environment Prior Level of Function : Independent/Modified  Independent ?  ?  ?  ?  ?  ?  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  ?  ? ?  ?  ?OT Problem List: Pain;Decreased activity tolerance;Obesity ?  ?   ?OT Treatment/Interventions: Self-care/ADL training;DME and/or AE instruction;Therapeutic activities;Patient/family education  ?  ?OT Goals(Current goals can be found in the care plan section) Acute Rehab OT Goals ?Patient Stated Goal: less pain ?OT Goal Formulation: With patient ?Time For Goal Achievement: 01/19/22 ?Potential to Achieve Goals: Good  ?OT Frequency: Min 2X/week ?  ? ?Co-evaluation   ?  ?  ?  ?  ? ?  ?AM-PAC OT "6 Clicks" Daily Activity     ?Outcome Measure Help from another person eating meals?: A Little ?Help from another person taking care of personal grooming?: A Little ?Help from another person toileting, which includes using toliet, bedpan, or urinal?: Total ?Help from another person bathing (including washing, rinsing, drying)?: A Lot ?Help from another person to put on and taking off regular upper body clothing?: A Little ?Help from another person to put on and taking off regular lower body clothing?: Total ?6 Click Score: 13 ?  ?End of Session Nurse Communication: Mobility status ? ?Activity Tolerance: Patient limited by pain ?Patient left: in bed;with bed alarm set ? ?OT Visit Diagnosis: Pain  ?              ?Time: 2426-8341 ?OT Time Calculation (min): 23 min ?Charges:  OT General Charges ?$OT Visit: 1 Visit ?OT Evaluation ?$OT Eval Low Complexity: 1 Low ? ?Jolinda Pinkstaff, OTR/L ?Acute Care Rehab Services  ?Office [phone] ?Pager: [phone]  ? ?Coltin Casher L Saharah Sherrow ?01/05/2022, 9:28 AM ?

## 2022-01-05 NOTE — Progress Notes (Signed)
Physical Therapy Treatment ?Patient Details ?Name: Lucas Brock ?MRN: [id number] ?DOB: Apr 03, 1951 ?Today's Date: 01/05/2022 ? ? ?History of Present Illness 71 y.o. male admitted 01/03/22 for Robotic assisted laparoscopic right radical nephrectomy. Post op R shoulder pain. PMH includes: CAD, HTN, LBBB. ? ?  ?PT Comments  ? ? Pt reports R shoulder/upper back pain has resolved, but his abdominal incision is causing severe pain. Pt received pain meds prior to PT session. Mod assist for supine to sit, min/guard assist for sit to stand. Pt ambulated 140' with RW, no loss of balance. Pt had audible wheezing/congested breath sounds especially in supine. SaO2 92% on room air in supine, 95% on room air walking. Pt required increased assistance for bed mobility 2* pain today. ?   ?Recommendations for follow up therapy are one component of a multi-disciplinary discharge planning process, led by the attending physician.  Recommendations may be updated based on patient status, additional functional criteria and insurance authorization. ? ?Follow Up Recommendations ? No PT follow up ?  ?  ?Assistance Recommended at Discharge Set up Supervision/Assistance  ?Patient can return home with the following Assist for transportation;Assistance with cooking/housework ?  ?Equipment Recommendations ? Rolling walker (2 wheels)  ?  ?Recommendations for Other Services   ? ? ?  ?Precautions / Restrictions Precautions ?Precautions: Other (comment) ?Precaution Comments: abdominal incision ?Restrictions ?Weight Bearing Restrictions: No  ?  ? ?Mobility ? Bed Mobility ?Overal bed mobility: Needs Assistance ?Bed Mobility: Supine to Sit ?  ?  ?Supine to sit: Mod assist, HOB elevated ?  ?  ?General bed mobility comments: Initially attempted log roll, pt reported pain was too severe to continue so switched to supine to sit with HOB fully elevated. assist to raise trunk, increased time 2* pain; wheezing/congested breath sounds noted in supine, SaO2 92% on  room air ?  ? ?Transfers ?Overall transfer level: Needs assistance ?Equipment used: Rolling walker (2 wheels) ?Transfers: Sit to/from Stand ?Sit to Stand: Min guard, From elevated surface ?  ?  ?  ?  ?  ?General transfer comment: VCs hand placement, increased time ?  ? ?Ambulation/Gait ?Ambulation/Gait assistance: Supervision ?Gait Distance (Feet): 140 Feet ?Assistive device: Rolling walker (2 wheels) ?Gait Pattern/deviations: Step-through pattern, Decreased stride length, Trunk flexed ?Gait velocity: decr ?  ?  ?General Gait Details: steady with RW, distance limited by pain, VCs for positioning in RW, SaO2 95%on room air ? ? ?Stairs ?  ?  ?  ?  ?  ? ? ?Wheelchair Mobility ?  ? ?Modified Rankin (Stroke Patients Only) ?  ? ? ?  ?Balance Overall balance assessment: Modified Independent ?  ?  ?  ?  ?  ?  ?  ?  ?  ?  ?  ?  ?  ?  ?  ?  ?  ?  ?  ? ?  ?Cognition Arousal/Alertness: Awake/alert ?Behavior During Therapy: [facility] for tasks assessed/performed ?Overall Cognitive Status: Within Functional Limits for tasks assessed ?  ?  ?  ?  ?  ?  ?  ?  ?  ?  ?  ?  ?  ?  ?  ?  ?  ?  ?  ? ?  ?Exercises   ? ?  ?General Comments   ?  ?  ? ?Pertinent Vitals/Pain Pain Assessment ?Pain Score: 9  ?Pain Location: incision/abdomen with movement/coughing ?Pain Descriptors / Indicators: Grimacing, Guarding, Sharp, Shooting ?Pain Intervention(s): Limited activity within patient's tolerance, Monitored during session, Premedicated before session, Repositioned  ? ? ?  Home Living   ?  ?  ?  ?  ?  ?  ?  ?  ?  ?   ?  ?Prior Function    ?  ?  ?   ? 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 ?  ?Frequency ? ? ? Min 3X/week ? ? ? ?  ?PT Plan Current plan remains appropriate   ? ? ?Co-evaluation   ?  ?  ?  ?  ? ?  ?AM-PAC PT "6 Clicks" Mobility   ?Outcome Measure ? Help needed turning from your back to your side while in a flat bed without using bedrails?: A Lot ?Help needed moving from lying on your back to sitting on the side of a flat bed without using bedrails?: A Lot ?Help needed moving to and from a bed to a chair (including a wheelchair)?: A Little ?Help needed standing up from a chair using your arms (e.g., wheelchair or bedside chair)?: A Little ?Help needed to walk in hospital room?: None ?Help needed climbing 3-5 steps with a railing? : A Little ?6 Click Score: 17 ? ?  ?End of Session   ?Activity Tolerance: Patient limited by pain ?Patient left: with call bell/phone within reach;in bed;with bed alarm set ?Nurse Communication: Mobility status ?PT Visit Diagnosis: Pain;Difficulty in walking, not elsewhere classified (R26.2) ?  ? ? ?Time: 6213-0865 ?PT Time Calculation (min) (ACUTE ONLY): 36 min ? ?Charges:  $Gait Training: 8-22 mins ?$Therapeutic Activity: 8-22 mins          ?          ? ?Philomena Doheny PT 01/05/2022  ?Acute Rehabilitation Services ?Pager [phone] ?Office [phone] ? ? ?

## 2022-01-06 LAB — BASIC METABOLIC PANEL
Anion gap: 3 — ABNORMAL LOW (ref 5–15)
BUN: 17 mg/dL (ref 8–23)
CO2: 26 mmol/L (ref 22–32)
Calcium: 8.4 mg/dL — ABNORMAL LOW (ref 8.9–10.3)
Chloride: 105 mmol/L (ref 98–111)
Creatinine, Ser: 1.57 mg/dL — ABNORMAL HIGH (ref 0.61–1.24)
GFR, Estimated: 47 mL/min — ABNORMAL LOW (ref >60)
Glucose, Bld: 113 mg/dL — ABNORMAL HIGH (ref 70–99)
Potassium: 3.7 mmol/L (ref 3.5–5.1)
Sodium: 134 mmol/L — ABNORMAL LOW (ref 135–145)

## 2022-01-06 NOTE — Progress Notes (Signed)
Pt to be discharged to home today. Pt given discharge teaching including all discharge Medications and schedules for these Medications. Pt verbalized understanding of all discharge instructions. Pt is waiting on delivery of wheelchair and then dc to home. Discharge AVS with the Patient at time of discharge  ?

## 2022-01-06 NOTE — TOC Transition Note (Addendum)
Transition of Care (TOC) - CM/SW Discharge Note ? ? ?Patient Details  ?Name: Ponce Skillman ?MRN: [id number] ?Date of Birth: 1951-05-12 ? ?Transition of Care [facility]) CM/SW Contact:  ?Dessa Phi, RN ?Phone Number: ?01/06/2022, 11:18 AM ? ? ?Clinical Narrative: d/c home today. No preference for dme co rw-[employer] rep Jermaine to deliver rw to rm prior d/c. No further CM needs.  ?-11:33a-spoke to urology nurse Mickel Baas informed of rw order need co sign from attending. No further Cm needs  ? ? ? ?Final next level of care: Home/Self Care ?Barriers to Discharge: No Barriers Identified ? ? ?Patient Goals and CMS Choice ?  ?CMS Medicare.gov Compare Post Acute Care list provided to:: Patient Represenative (must comment) ?Choice offered to / list presented to : Spouse ? ?Discharge Placement ?  ?           ?  ?  ?  ?  ? ?Discharge Plan and Services ?  ?Discharge Planning Services: CM Consult ?           ?DME Arranged: Walker rolling ?DME Agency: [employer] ?Date DME Agency Contacted: 01/06/22 ?Time DME Agency Contacted: [phone] ?Representative spoke with at DME Agency: Brenton Grills ?  ?  ?  ?  ?  ? ?Social Determinants of Health (SDOH) Interventions ?  ? ? ?Readmission Risk Interventions ?   ? View : No data to display.  ?  ?  ?  ? ? ? ? ? ?

## 2022-01-06 NOTE — Discharge Summary (Addendum)
Date of admission: 01/03/2022 ? ?Date of discharge: 01/06/2022 ? ?Admission diagnosis: Renal Mass  ? ?Discharge diagnosis: Renal Mass  ? ?Secondary diagnoses:  ?Patient Active Problem List  ? Diagnosis Date Noted  ? Renal mass 01/03/2022  ? ? ?Procedures performed: ?Procedure(s): ?XI ROBOTIC ASSITED PARTIAL NEPHRECTOMY, POSSIBLE RADICAL NEPHRECTOMY ? ?History and Physical: For full details, please see admission history and physical. Briefly, Lucas Brock is a 71 y.o. year old patient with history of right renal mass abutting right collecting system he presented for right partial versus radical nephrectomy.  ? ?Hospital Course: Patient tolerated the procedure well.  He was then transferred to the floor after an uneventful PACU stay.  His hospital course was uncomplicated. On POD1 he was ambulating with assistance and tolerating a regular diet. He had some right shoulder pain likely related to positioning during surgery and we evaluated and cleared by physical therapy. He remained admitted to trend his creatinine which ultimately plateaued at 1.5. On POD2 he had a brief oxygen requirement related to taking shallow breaths due to abdominal pain. CXR shows atelectasis. This improved throughout the day and by POD3 he had been weaned off of O2. His foley catheter was removed and he was instructed to continue his home catheterization regimen. On POD#3 he had met discharge criteria: was eating a regular diet, was up and ambulating independently,  pain was well controlled,and was ready to for discharge.He has follow up on 5/8.  ? ? ?Laboratory values:  ?Recent Labs  ?  01/03/22 ?1816 01/04/22 ?0445 01/05/22 ?0454  ?WBC  --   --  10.8*  ?HGB 14.0 12.9* 12.7*  ?HCT 42.3 39.1 39.2  ? ?Recent Labs  ?  01/04/22 ?0445 01/05/22 ?0454 01/06/22 ?0542  ?NA 134* 140 134*  ?K 4.5 3.8 3.7  ?CL 103 109 105  ?CO2 23 25 26   ?GLUCOSE 126* 118* 113*  ?BUN 21 21 17   ?CREATININE 1.54* 1.52* 1.57*  ?CALCIUM 8.2* 8.7* 8.4*  ? ?No results for  input(s): LABPT, INR in the last 72 hours. ?No results for input(s): LABURIN in the last 72 hours. ?No results found for this or any previous visit. ? ?Disposition: Home ? ?Discharge instruction: The patient was instructed to be ambulatory but told to refrain from heavy lifting, strenuous activity, or driving.  ? ?Discharge medications:  ?Allergies as of 01/06/2022   ? ?   Reactions  ? Sulfamethoxazole-trimethoprim Anaphylaxis  ? Ciprofloxacin   ? Other reaction(s): Dizziness  ? ?  ? ?  ?Medication List  ?  ? ?STOP taking these medications   ? ?aspirin EC 81 MG tablet ?  ?ibuprofen 200 MG tablet ?Commonly known as: ADVIL ?  ? ?  ? ?TAKE these medications   ? ?acetaminophen 325 MG tablet ?Commonly known as: TYLENOL ?Take 650 mg by mouth every 6 (six) hours as needed for moderate pain, mild pain, fever or headache. ?  ?carvedilol 6.25 MG tablet ?Commonly known as: COREG ?Take 6.25 mg by mouth 2 (two) times daily with a meal. ?  ?docusate sodium 100 MG capsule ?Commonly known as: COLACE ?Take 1 capsule (100 mg total) by mouth 2 (two) times daily. ?  ?Entresto 24-26 MG ?Generic drug: sacubitril-valsartan ?Take 2 tablets by mouth 2 (two) times daily. ?  ?HYDROcodone-acetaminophen 5-325 MG tablet ?Commonly known as: Norco ?Take 1-2 tablets by mouth every 6 (six) hours as needed for moderate pain or severe pain. ?  ? ?  ? ? ?Followup:  ? Follow-up Information   ? ?  Janith Lima, MD Follow up on 01/17/2022.   ?Specialty: Urology ?Why: at 8:15 ?Contact information: ?[place] ?[place] [address] ?[phone] ? ? ?  ?  ? ?  ?  ? ?  ? ? Matt R. Dannis Deroche MD ?[facility]  ?Pager: [phone] ? ?

## 2022-01-06 NOTE — Progress Notes (Signed)
Pt's walker now arrived Pt discharged to home with discharge paperwork and walker  ?

## 2022-01-07 LAB — SURGICAL PATHOLOGY

## 2022-01-13 ENCOUNTER — Other Ambulatory Visit: Payer: Self-pay | Admitting: Urology

## 2022-03-14 ENCOUNTER — Ambulatory Visit
Admission: EM | Admit: 2022-03-14 | Discharge: 2022-03-14 | Disposition: A | Discharge status: Home / Self Care | Payer: BC Managed Care – PPO | Attending: Nurse Practitioner | Admitting: Nurse Practitioner

## 2022-03-14 DIAGNOSIS — M6281 Muscle weakness (generalized): Secondary | ICD-10-CM

## 2022-03-14 DIAGNOSIS — N3000 Acute cystitis without hematuria: Secondary | ICD-10-CM | POA: Diagnosis present

## 2022-03-14 LAB — POCT URINALYSIS DIP (MANUAL ENTRY)
Glucose, UA: NEGATIVE mg/dL
Ketones, POC UA: NEGATIVE mg/dL
Leukocytes, UA: NEGATIVE
Nitrite, UA: POSITIVE — AB
Protein Ur, POC: 100 mg/dL — AB
Spec Grav, UA: >=1.03 — AB (ref 1.010–1.025)
Urobilinogen, UA: 0.2 E.U./dL
pH, UA: 6 (ref 5.0–8.0)

## 2022-03-14 MED ORDER — AMOXICILLIN-POT CLAVULANATE 875-125 MG PO TABS: 1.0000 | ORAL_TABLET | Freq: Two times a day (BID) | ORAL | 0 refills | Status: DC

## 2022-03-14 NOTE — Discharge Instructions (Addendum)
-  Your urinalysis does indicate that you have a urinary tract infection.  A urine culture is pending to ensure you are being treated with the correct antibiotic.  You will be contacted if the medication needs to be changed. -Take medications as prescribed. -Increase fluids. -Tylenol for pain, fever, or general discomfort. -You should be self-catheterizing every 2 hours while symptoms persist.-Avoid caffeine to include tea, soda, and coffee. -Follow-up in the emergency department if you develop fever, chills, increased fatigue, abdominal pain, low back pain, urinary retention or other concerns.   For your hand, as discussed, it is recommended that you follow-up with a hand specialist.  She can follow-up with [facility] [place] with Dr. Arther Abbott at [phone] or with your primary care physician for further evaluation.

## 2022-03-14 NOTE — ED Provider Notes (Signed)
[facility]    CSN: [id number] Arrival date & time: 03/14/22  0947      History   Chief Complaint Chief Complaint  Patient presents with   Dysuria    HPI Lucas Brock is a 71 y.o. male.    Dysuria Presenting symptoms: dysuria     Patient presents for complaints of dark urine and feeling "sick to his stomach" for the past 5 days.  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  He also has been self catheterizing for the past 17 years.  Patient denies fever, chills, abdominal pain, low back pain, hematuria, foul-smelling urine, urgency or frequency.  He is currently being seen by nephrologist.  He is scheduled to see nephrology in August.  Patient also presents for complaints of weakness in the left hand.  He states that symptoms have been present for the past 3 to 4 weeks.  He complains of weakness between the index finger and thumb and also notes numbness and tingling of the fourth and fifth digits.  He denies injury, trauma, swelling, pain, or other neurological disorder. Past Medical History:  Diagnosis Date   Coronary artery disease    Dyspnea 2022   with exertion   HFrEF (heart failure with reduced ejection fraction) ([place])    History of kidney stones    Hypertension    LBBB (left bundle branch block)    Nonischemic cardiomyopathy ([place])    Pneumonia 2020   Stroke [facility])    TIA age 57    Patient Active Problem List   Diagnosis Date Noted   Renal mass 01/03/2022    Past Surgical History:  Procedure Laterality Date   ESOPHAGOGASTRODUODENOSCOPY N/A 04/23/2016   Procedure: ESOPHAGOGASTRODUODENOSCOPY (EGD);  Surgeon: Rogene Houston, MD;  [facility];  Service: Endoscopy;  Laterality: N/A;   KIDNEY STONE SURGERY     age 16 with ruptured ureter   KNEE SURGERY Bilateral    2013, LT, Rt 2003   ROBOTIC ASSITED PARTIAL NEPHRECTOMY Right 01/03/2022   Procedure: XI ROBOTIC ASSITED PARTIAL NEPHRECTOMY, POSSIBLE RADICAL NEPHRECTOMY;   Surgeon: Janith Lima, MD;  Location: [facility];  Service: Urology;  Laterality: Right;  ONLY NEEDS 240 MIN   TONSILLECTOMY     TRANSURETHRAL RESECTION OF PROSTATE     age 67   TURP VAPORIZATION         Home Medications    Prior to Admission medications   Medication Sig Start Date End Date Taking? Authorizing Provider  amoxicillin-clavulanate (AUGMENTIN) 875-125 MG tablet Take 1 tablet by mouth every 12 (twelve) hours. 03/14/22  Yes Fayne Mcguffee-Warren, Alda Lea, NP  acetaminophen (TYLENOL) 325 MG tablet Take 650 mg by mouth every 6 (six) hours as needed for moderate pain, mild pain, fever or headache.    [provider]  carvedilol (COREG) 6.25 MG tablet Take 6.25 mg by mouth 2 (two) times daily with a meal.    [provider]  docusate sodium (COLACE) 100 MG capsule Take 1 capsule (100 mg total) by mouth 2 (two) times daily. 01/03/22   Dancy, Estill Bamberg, PA-C  ENTRESTO 24-26 MG Take 2 tablets by mouth 2 (two) times daily. 08/08/21   [provider]  HYDROcodone-acetaminophen (NORCO) 5-325 MG tablet Take 1-2 tablets by mouth every 6 (six) hours as needed for moderate pain or severe pain. 01/03/22   Debbrah Alar, PA-C    Family History History reviewed. No pertinent family history.  Social History Social History  Tobacco Use   Smoking status: Former    Packs/day: 3.00    Years: 10.00    Total pack years: 30.00    Types: Cigarettes    Quit date: 49    Years since quitting: 41.5   Smokeless tobacco: Never  Vaping Use   Vaping Use: Never used  Substance Use Topics   Alcohol use: Not Currently   Drug use: No     Allergies   Sulfamethoxazole-trimethoprim and Ciprofloxacin   Review of Systems Review of Systems  Genitourinary:  Positive for dysuria.   Per HPI  Physical Exam Triage Vital Signs ED Triage Vitals  Enc Vitals Group     BP 03/14/22 1038 128/84     Pulse Rate 03/14/22 1038 67     Resp 03/14/22 1038 18     Temp 03/14/22 1038 98.7 F  (37.1 C)     Temp Source 03/14/22 1038 Oral     SpO2 03/14/22 1038 94 %     Weight --      Height --      Head Circumference --      Peak Flow --      Pain Score 03/14/22 1032 0     Pain Loc --      Pain Edu? --      Excl. in [place]? --    No data found.  Updated Vital Signs BP 128/84 (BP Location: Right Arm)   Pulse 67   Temp 98.7 F (37.1 C) (Oral)   Resp 18   SpO2 94%   Visual Acuity Right Eye Distance:   Left Eye Distance:   Bilateral Distance:    Right Eye Near:   Left Eye Near:    Bilateral Near:     Physical Exam Vitals and nursing note reviewed.  Constitutional:      General: He is not in acute distress.    Appearance: Normal appearance. He is well-developed.  HENT:     Head: Normocephalic and atraumatic.     Mouth/Throat:     Mouth: Mucous membranes are moist.  Eyes:     Extraocular Movements: Extraocular movements intact.     Conjunctiva/sclera: Conjunctivae normal.     Pupils: Pupils are equal, round, and reactive to light.  Cardiovascular:     Rate and Rhythm: Normal rate and regular rhythm.     Heart sounds: No murmur heard. Pulmonary:     Effort: Pulmonary effort is normal. No respiratory distress.     Breath sounds: Normal breath sounds.  Abdominal:     Palpations: Abdomen is soft.     Tenderness: There is no abdominal tenderness. There is no right CVA tenderness or left CVA tenderness.  Musculoskeletal:        General: No swelling.     Cervical back: Normal range of motion and neck supple.  Lymphadenopathy:     Cervical: No cervical adenopathy.  Skin:    General: Skin is warm and dry.     Capillary Refill: Capillary refill takes less than 2 seconds.  Neurological:     General: No focal deficit present.     Mental Status: He is alert and oriented to person, place, and time.  Psychiatric:        Mood and Affect: Mood normal.        Behavior: Behavior normal.      UC Treatments / Results  Labs (all labs ordered are listed, but only  abnormal results are displayed) Labs Reviewed  POCT URINALYSIS  DIP (MANUAL ENTRY) - Abnormal; Notable for the following components:      Result Value   Color, UA orange (*)    Clarity, UA cloudy (*)    Bilirubin, UA small (*)    Spec Grav, UA >=1.030 (*)    Blood, UA moderate (*)    Protein Ur, POC =100 (*)    Nitrite, UA Positive (*)    All other components within normal limits  URINE CULTURE    EKG   Radiology No results found.  Procedures Procedures (including critical care time)  Medications Ordered in UC Medications - No data to display  Initial Impression / Assessment and Plan / UC Course  I have reviewed the triage vital signs and the nursing notes.  Pertinent labs & imaging results that were available during my care of the patient were reviewed by me and considered in my medical decision making (see chart for details).  Presents for urinary symptoms that been present for the past week.  Patient has a history of a right nephrectomy and self catheterizes.  On exam, his vital signs are stable, he is in no acute distress.  No left CVA tenderness.  Urinalysis is positive for nitrates.  With regard to his left hand, recommend that he follow-up with hand specialist, patient was provided information for [facility] [place] [facility].  Urine culture is pending at this time to ensure he is being treated with the appropriate antibiotic.  Patient was given supportive care recommendations along with strict indications of when to go to the emergency department.  The patient was advised to follow-up with nephrology in August as scheduled.  Patient advised to follow-up as needed. Final Clinical Impressions(s) / UC Diagnoses   Final diagnoses:  Acute cystitis without hematuria  Hand muscle weakness     Discharge Instructions      -Your urinalysis does indicate that you have a urinary tract infection.  A urine culture is pending to ensure you are being treated with the correct  antibiotic.  You will be contacted if the medication needs to be changed. -Take medications as prescribed. -Increase fluids. -Tylenol for pain, fever, or general discomfort. -You should be self-catheterizing every 2 hours while symptoms persist.-Avoid caffeine to include tea, soda, and coffee. -Follow-up in the emergency department if you develop fever, chills, increased fatigue, abdominal pain, low back pain, urinary retention or other concerns.   For your hand, as discussed, it is recommended that you follow-up with a hand specialist.  She can follow-up with [facility] [place] with Dr. Arther Abbott at [phone] or with your primary care physician for further evaluation.     ED Prescriptions     Medication Sig Dispense Auth. Provider   amoxicillin-clavulanate (AUGMENTIN) 875-125 MG tablet Take 1 tablet by mouth every 12 (twelve) hours. 14 tablet Lucas Brock, Alda Lea, NP      PDMP not reviewed this encounter.   Tish Men, NP 03/14/22 [phone]

## 2022-03-14 NOTE — ED Triage Notes (Signed)
Pt reports urine is dark on and off since April 2023 after 1 kidney removal; diarrhea and "sick to my stomach" x 5 days.  Pepto Bismol gives relief.   Pt reports loosing muscle in hands, tingling in left pinky finger an left ring finger x 3 weeks.   Pt reports he has prednisone left over, he takes some time to time when wheezing.

## 2022-03-16 LAB — URINE CULTURE: Culture: >=100000 — AB

## 2022-03-31 ENCOUNTER — Other Ambulatory Visit: Payer: Self-pay | Admitting: Urology

## 2022-03-31 DIAGNOSIS — D3011 Benign neoplasm of right renal pelvis: Secondary | ICD-10-CM

## 2022-04-18 ENCOUNTER — Ambulatory Visit
Admission: RE | Admit: 2022-04-18 | Discharge: 2022-04-18 | Disposition: A | Discharge status: Home / Self Care | Payer: BC Managed Care – PPO | Source: Ambulatory Visit | Attending: Urology | Admitting: Urology

## 2022-04-18 DIAGNOSIS — D3011 Benign neoplasm of right renal pelvis: Secondary | ICD-10-CM

## 2022-04-18 MED ORDER — GADOBENATE DIMEGLUMINE 529 MG/ML IV SOLN
20.0000 mL | Freq: Once | INTRAVENOUS | Status: AC | PRN
  Administered 2022-04-18: 20 mL via INTRAVENOUS

## 2022-06-22 ENCOUNTER — Ambulatory Visit
Admission: EM | Admit: 2022-06-22 | Discharge: 2022-06-22 | Disposition: A | Discharge status: Home / Self Care | Payer: BC Managed Care – PPO

## 2022-06-22 DIAGNOSIS — J069 Acute upper respiratory infection, unspecified: Secondary | ICD-10-CM

## 2022-06-22 DIAGNOSIS — R062 Wheezing: Secondary | ICD-10-CM | POA: Diagnosis not present

## 2022-06-22 MED ORDER — PREDNISONE 20 MG PO TABS: 40.0000 mg | ORAL_TABLET | Freq: Every day | ORAL | 0 refills | Status: DC

## 2022-06-22 MED ORDER — PROMETHAZINE-DM 6.25-15 MG/5ML PO SYRP: 5.0000 mL | ORAL_SOLUTION | Freq: Four times a day (QID) | ORAL | 0 refills | Status: AC | PRN

## 2022-06-22 MED ORDER — ALBUTEROL SULFATE HFA 108 (90 BASE) MCG/ACT IN AERS: 2.0000 | INHALATION_SPRAY | RESPIRATORY_TRACT | 0 refills | Status: AC | PRN

## 2022-06-22 NOTE — ED Provider Notes (Signed)
[facility]    CSN: [id number] Arrival date & time: 06/22/22  0858      History   Chief Complaint Chief Complaint  Patient presents with   Cough   Fever   Sore Throat         HPI Lucas Brock is a 71 y.o. male.   Patient presenting today with 3-day history of cough, sore throat, congestion, sneezing.  Denies fever, chills, body aches, chest pain, shortness of breath.  Tried Alka-Seltzer plus with mild temporary relief of symptoms.  Has been around a lot of people lately at a motorcycle event, thinks this is where he picked something up.  No known history of chronic pulmonary disease but has had pneumonia in the past.    Past Medical History:  Diagnosis Date   Coronary artery disease    Dyspnea 2022   with exertion   HFrEF (heart failure with reduced ejection fraction) ([place])    History of kidney stones    Hypertension    LBBB (left bundle branch block)    Nonischemic cardiomyopathy ([place])    Pneumonia 2020   Stroke [facility])    TIA age 10    Patient Active Problem List   Diagnosis Date Noted   Renal mass 01/03/2022    Past Surgical History:  Procedure Laterality Date   ESOPHAGOGASTRODUODENOSCOPY N/A 04/23/2016   Procedure: ESOPHAGOGASTRODUODENOSCOPY (EGD);  Surgeon: Rogene Houston, MD;  [facility];  Service: Endoscopy;  Laterality: N/A;   KIDNEY STONE SURGERY     age 73 with ruptured ureter   KNEE SURGERY Bilateral    2013, LT, Rt 2003   ROBOTIC ASSITED PARTIAL NEPHRECTOMY Right 01/03/2022   Procedure: XI ROBOTIC ASSITED PARTIAL NEPHRECTOMY, POSSIBLE RADICAL NEPHRECTOMY;  Surgeon: Janith Lima, MD;  [facility];  Service: Urology;  Laterality: Right;  ONLY NEEDS 240 MIN   TONSILLECTOMY     TRANSURETHRAL RESECTION OF PROSTATE     age 51   TURP VAPORIZATION         Home Medications    Prior to Admission medications   Medication Sig Start Date End Date Taking? Authorizing Provider  albuterol (VENTOLIN HFA) 108 (90 Base)  MCG/ACT inhaler Inhale 2 puffs into the lungs every 4 (four) hours as needed for wheezing or shortness of breath. 06/22/22  Yes Volney American, PA-C  diphenhydrAMINE HCl (ALKA-SELTZER PLUS ALLERGY PO) Take by mouth.   Yes [provider]  predniSONE (DELTASONE) 20 MG tablet Take 2 tablets (40 mg total) by mouth daily with breakfast. 06/22/22  Yes Volney American, PA-C  promethazine-dextromethorphan (PROMETHAZINE-DM) 6.25-15 MG/5ML syrup Take 5 mLs by mouth 4 (four) times daily as needed. 06/22/22  Yes Volney American, PA-C  acetaminophen (TYLENOL) 325 MG tablet Take 650 mg by mouth every 6 (six) hours as needed for moderate pain, mild pain, fever or headache.    [provider]  amoxicillin-clavulanate (AUGMENTIN) 875-125 MG tablet Take 1 tablet by mouth every 12 (twelve) hours. 03/14/22   Leath-Warren, Alda Lea, NP  carvedilol (COREG) 6.25 MG tablet Take 6.25 mg by mouth 2 (two) times daily with a meal.    [provider]  docusate sodium (COLACE) 100 MG capsule Take 1 capsule (100 mg total) by mouth 2 (two) times daily. 01/03/22   Dancy, Estill Bamberg, PA-C  ENTRESTO 24-26 MG Take 2 tablets by mouth 2 (two) times daily. 08/08/21   [provider]  HYDROcodone-acetaminophen (NORCO) 5-325 MG tablet Take 1-2 tablets by  mouth every 6 (six) hours as needed for moderate pain or severe pain. 01/03/22   Debbrah Alar, PA-C    Family History History reviewed. No pertinent family history.  Social History Social History   Tobacco Use   Smoking status: Former    Packs/day: 3.00    Years: 10.00    Total pack years: 30.00    Types: Cigarettes    Quit date: 1982    Years since quitting: 41.8   Smokeless tobacco: Never  Vaping Use   Vaping Use: Never used  Substance Use Topics   Alcohol use: Not Currently   Drug use: No     Allergies   Sulfamethoxazole-trimethoprim and Ciprofloxacin   Review of Systems Review of Systems Per HPI  Physical  Exam Triage Vital Signs ED Triage Vitals [06/22/22 0905]  Enc Vitals Group     BP (!) 172/97     Pulse Rate 65     Resp 18     Temp 98.1 F (36.7 C)     Temp Source Oral     SpO2 93 %     Weight      Height      Head Circumference      Peak Flow      Pain Score 4     Pain Loc      Pain Edu?      Excl. in [place]?    No data found.  Updated Vital Signs BP (!) 172/97 (BP Location: Right Arm)   Pulse 65   Temp 98.1 F (36.7 C) (Oral)   Resp 18   SpO2 93%   Visual Acuity Right Eye Distance:   Left Eye Distance:   Bilateral Distance:    Right Eye Near:   Left Eye Near:    Bilateral Near:     Physical Exam Vitals and nursing note reviewed.  Constitutional:      Appearance: He is well-developed.  HENT:     Head: Atraumatic.     Right Ear: External ear normal.     Left Ear: External ear normal.     Nose: Rhinorrhea present.     Mouth/Throat:     Mouth: Mucous membranes are moist.     Pharynx: Posterior oropharyngeal erythema present. No oropharyngeal exudate.  Eyes:     Conjunctiva/sclera: Conjunctivae normal.     Pupils: Pupils are equal, round, and reactive to light.  Cardiovascular:     Rate and Rhythm: Normal rate and regular rhythm.  Pulmonary:     Effort: Pulmonary effort is normal. No respiratory distress.     Breath sounds: Wheezing present. No rales.     Comments: Wheezes audible in the right lower lung field on auscultation Musculoskeletal:        General: Normal range of motion.     Cervical back: Normal range of motion and neck supple.  Lymphadenopathy:     Cervical: No cervical adenopathy.  Skin:    General: Skin is warm and dry.  Neurological:     Mental Status: He is alert and oriented to person, place, and time.  Psychiatric:        Behavior: Behavior normal.      UC Treatments / Results  Labs (all labs ordered are listed, but only abnormal results are displayed) Labs Reviewed - No data to display  EKG   Radiology No results  found.  Procedures Procedures (including critical care time)  Medications Ordered in UC Medications - No data to display  Initial Impression / Assessment and Plan / UC Course  I have reviewed the triage vital signs and the nursing notes.  Pertinent labs & imaging results that were available during my care of the patient were reviewed by me and considered in my medical decision making (see chart for details).     Mildly hypertensive in triage, otherwise vital signs reassuring.  Exam revealing evidence of a viral upper respiratory infection.  Declines viral testing today, given wheezes we will treat for bronchitis with prednisone, Phenergan DM, albuterol inhaler  Final Clinical Impressions(s) / UC Diagnoses   Final diagnoses:  Viral URI with cough  Wheezing     Discharge Instructions      Continue taking the over-the-counter cold congestion medications, add Flonase, sinus rinses twice daily to help clear your sinuses.  Make sure to reserve the cough syrup that I have prescribed for times where you will be driving as it can make you a bit sleepy.  The other medications will not make you sleepy.    ED Prescriptions     Medication Sig Dispense Auth. Provider   predniSONE (DELTASONE) 20 MG tablet Take 2 tablets (40 mg total) by mouth daily with breakfast. 10 tablet Volney American, PA-C   promethazine-dextromethorphan (PROMETHAZINE-DM) 6.25-15 MG/5ML syrup Take 5 mLs by mouth 4 (four) times daily as needed. 100 mL Volney American, PA-C   albuterol (VENTOLIN HFA) 108 (90 Base) MCG/ACT inhaler Inhale 2 puffs into the lungs every 4 (four) hours as needed for wheezing or shortness of breath. 18 g Volney American, [place]      PDMP not reviewed this encounter.   Volney American, [place] 06/22/22 1103

## 2022-06-22 NOTE — Discharge Instructions (Signed)
Continue taking the over-the-counter cold congestion medications, add Flonase, sinus rinses twice daily to help clear your sinuses.  Make sure to reserve the cough syrup that I have prescribed for times where you will be driving as it can make you a bit sleepy.  The other medications will not make you sleepy.

## 2022-06-22 NOTE — ED Triage Notes (Signed)
Pt reports cough, sore throat, nasal congestion and sneezing x 3 days. Alka Selter Plus gives some relief.

## 2022-07-14 ENCOUNTER — Other Ambulatory Visit: Payer: Self-pay | Admitting: Family Medicine

## 2022-07-14 NOTE — Telephone Encounter (Signed)
Unable to refill per protocol, last refill by another provider.   Requested Prescriptions  Pending Prescriptions Disp Refills   albuterol (VENTOLIN HFA) 108 (90 Base) MCG/ACT inhaler [Pharmacy Med Name: ALBUTEROL HFA (VENTOLIN) INH] 18 each     Sig: INHALE 2 PUFFS INTO THE LUNGS EVERY 4 HOURS AS NEEDED FOR WHEEZING OR SHORTNESS OF BREATH.     There is no refill protocol information for this order

## 2022-10-06 ENCOUNTER — Other Ambulatory Visit: Payer: Self-pay | Admitting: Urology

## 2022-10-06 DIAGNOSIS — C651 Malignant neoplasm of right renal pelvis: Secondary | ICD-10-CM

## 2022-10-30 ENCOUNTER — Ambulatory Visit
Admission: RE | Admit: 2022-10-30 | Discharge: 2022-10-30 | Disposition: A | Discharge status: Home / Self Care | Payer: BC Managed Care – PPO | Source: Ambulatory Visit | Attending: Urology | Admitting: Urology

## 2022-10-30 DIAGNOSIS — C651 Malignant neoplasm of right renal pelvis: Secondary | ICD-10-CM

## 2022-10-30 MED ORDER — GADOPICLENOL 0.5 MMOL/ML IV SOLN
10.0000 mL | Freq: Once | INTRAVENOUS | Status: AC | PRN
  Administered 2022-10-30: 10 mL via INTRAVENOUS

## 2022-11-02 ENCOUNTER — Encounter: Payer: Self-pay | Admitting: Gastroenterology

## 2022-12-26 ENCOUNTER — Ambulatory Visit: Payer: BC Managed Care – PPO | Admitting: Gastroenterology

## 2023-01-18 ENCOUNTER — Ambulatory Visit (INDEPENDENT_AMBULATORY_CARE_PROVIDER_SITE_OTHER): Payer: Medicare Other | Admitting: Gastroenterology

## 2023-01-18 ENCOUNTER — Encounter: Payer: Self-pay | Admitting: Gastroenterology

## 2023-01-18 VITALS — BP 122/88 | HR 67 | Ht 68.0 in | Wt 226.0 lb

## 2023-01-18 DIAGNOSIS — R195 Other fecal abnormalities: Secondary | ICD-10-CM | POA: Diagnosis not present

## 2023-01-18 DIAGNOSIS — R1319 Other dysphagia: Secondary | ICD-10-CM | POA: Diagnosis not present

## 2023-01-18 MED ORDER — MOTEGRITY 2 MG PO TABS: 1.0000 | ORAL_TABLET | Freq: Every day | ORAL | 1 refills | Status: DC

## 2023-01-18 MED ORDER — PLENVU 140 G PO SOLR: 1.0000 | Freq: Once | ORAL | 0 refills | Status: AC

## 2023-01-18 NOTE — Patient Instructions (Signed)
_______________________________________________________  If your blood pressure at your visit was 140/90 or greater, please contact your primary care physician to follow up on this.  _______________________________________________________  If you are age 70 or older, your body mass index should be between 23-30. Your Body mass index is 34.36 kg/m. If this is out of the aforementioned range listed, please consider follow up with your Primary Care Provider.  If you are age 25 or younger, your body mass index should be between 19-25. Your Body mass index is 34.36 kg/m. If this is out of the aformentioned range listed, please consider follow up with your Primary Care Provider.   ________________________________________________________  The [facility] providers would like to encourage you to use [facility] to communicate with providers for non-urgent requests or questions.  Due to long hold times on the telephone, sending your provider a message by [facility] may be a faster and more efficient way to get a response.  Please allow 48 business hours for a response.  Please remember that this is for non-urgent requests.  _______________________________________________________  Lucas Brock have been scheduled for an endoscopy and colonoscopy. Please follow the written instructions given to you at your visit today. Please pick up your prep supplies at the pharmacy within the next 1-3 days. If you use inhalers (even only as needed), please bring them with you on the day of your procedure.

## 2023-01-18 NOTE — Progress Notes (Signed)
[facility] Consult Note:  History: Lucas Brock 01/18/2023  Referring provider: Glade Stanford, FNP  Reason for consult/chief complaint: Colonoscopy (Pt states he would like to discuss a colon.)   Subjective  HPI: This man was referred by his PCP in [place] for heme positive stool done for colorectal cancer screening after primary care visit December 2023.  Referral indicates this test result was positive, and the patient was initially referred to a GI practice in that area but was unable to get an appointment and subsequently referred to our office.  Lab reports from that December visit showed creatinine of 1.2 and hemoglobin of 14.  Lucas Brock was here with his wife today to discuss the stool test result and consideration of colonoscopy.  Further review of records also noted that he had an upper endoscopy by Dr. [name] August 2017 for esophageal food impaction.  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 No colonoscopy report on file (because he has never had one done)  He has occasional small-volume painless bleeding that he has attributed to hemorrhoid.  BMs are regular.  He has ongoing intermittent solid food dysphagia without heartburn.  No antacid medicines.   ROS:  Review of Systems  Constitutional:  Negative for appetite change and unexpected weight change.  HENT:  Negative for mouth sores and voice change.   Eyes:  Negative for pain and redness.  Respiratory:  Negative for cough and shortness of breath.   Cardiovascular:  Negative for chest pain and palpitations.  Genitourinary:  Negative for dysuria and hematuria.       Urinary retention for which he has long had to self catheterize  Musculoskeletal:  Negative for arthralgias and myalgias.  Skin:  Negative for pallor and rash.   Neurological:  Negative for weakness and headaches.  Hematological:  Negative for adenopathy.    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  He cannot recall when his last echocardiogram was done.  Past Surgical History: Past Surgical History:  Procedure Laterality Date   ESOPHAGOGASTRODUODENOSCOPY N/A 04/23/2016   Procedure: ESOPHAGOGASTRODUODENOSCOPY (EGD);  Surgeon: Malissa Hippo, MD;  Location: [facility];  Service: Endoscopy;  Laterality: N/A;   KIDNEY STONE SURGERY     age 30 with ruptured ureter   KNEE SURGERY Bilateral    2013, LT, Rt 2003   ROBOTIC ASSITED PARTIAL NEPHRECTOMY Right 01/03/2022   Procedure: XI ROBOTIC ASSITED PARTIAL NEPHRECTOMY, POSSIBLE RADICAL NEPHRECTOMY;  Surgeon: Jannifer Hick, MD;  Location: [facility];  Service: Urology;  Laterality: Right;  ONLY NEEDS 240 MIN   TONSILLECTOMY     TRANSURETHRAL RESECTION OF PROSTATE     age 80   TURP VAPORIZATION  Family History: Family History  Problem Relation Age of Onset   High blood pressure Mother    Diabetes Father    Colon cancer Neg Hx    Esophageal cancer Neg Hx    Liver disease Neg Hx     Social History: Social History   Socioeconomic History   Marital status: Single    Spouse name: Not on file   Number of children: 3   Years of education: Not on file   Highest education level:  Not on file  Occupational History   Occupation: retired  Tobacco Use   Smoking status: Former    Packs/day: 3.00    Years: 10.00    Additional pack years: 0.00    Total pack years: 30.00    Types: Cigarettes    Quit date: 1982    Years since quitting: 42.3   Smokeless tobacco: Never  Vaping Use   Vaping Use: Never used  Substance and Sexual Activity   Alcohol use: Not Currently    Comment: soically   Drug use: No   Sexual activity: Not on file  Other Topics Concern   Not on file  Social History Narrative   Not on file   Social Determinants of Health   Financial Resource Strain: Not on file  Food Insecurity: Not on file  Transportation Needs: Not on file  Physical Activity: Not on file  Stress: Not on file  Social Connections: Not on file    Allergies: Allergies  Allergen Reactions   Sulfamethoxazole-Trimethoprim Anaphylaxis   Ciprofloxacin     Other reaction(s): Dizziness    Outpatient Meds: Current Outpatient Medications  Medication Sig Dispense Refill   acetaminophen (TYLENOL) 325 MG tablet Take 650 mg by mouth every 6 (six) hours as needed for moderate pain, mild pain, fever or headache.     albuterol (VENTOLIN HFA) 108 (90 Base) MCG/ACT inhaler Inhale 2 puffs into the lungs every 4 (four) hours as needed for wheezing or shortness of breath. 18 g 0   carvedilol (COREG) 6.25 MG tablet Take 6.25 mg by mouth 2 (two) times daily with a meal.     diphenhydrAMINE HCl (ALKA-SELTZER PLUS ALLERGY PO) Take by mouth.     docusate sodium (COLACE) 100 MG capsule Take 1 capsule (100 mg total) by mouth 2 (two) times daily.     ENTRESTO 24-26 MG Take 2 tablets by mouth 2 (two) times daily. 75mg      HYDROcodone-acetaminophen (NORCO) 5-325 MG tablet Take 1-2 tablets by mouth every 6 (six) hours as needed for moderate pain or severe pain. 20 tablet 0   PEG-KCl-NaCl-NaSulf-Na Asc-C (PLENVU) 140 g SOLR Take 1 kit by mouth once for 1 dose. 1 each 0   promethazine-dextromethorphan  (PROMETHAZINE-DM) 6.25-15 MG/5ML syrup Take 5 mLs by mouth 4 (four) times daily as needed. 100 mL 0   Prucalopride Succinate (MOTEGRITY) 2 MG TABS Take 1 tablet (2 mg total) by mouth daily. 30 tablet 1   amoxicillin-clavulanate (AUGMENTIN) 875-125 MG tablet Take 1 tablet by mouth every 12 (twelve) hours. (Patient not taking: Reported on 01/18/2023) 14 tablet 0   predniSONE (DELTASONE) 20 MG tablet Take 2 tablets (40 mg total) by mouth daily with breakfast. (Patient not taking: Reported on 01/18/2023) 10 tablet 0   No current facility-administered medications for this visit.      ___________________________________________________________________ Objective   Exam:  BP 122/88   Pulse 67   Ht 5\' 8"  (1.727 m)   Wt 226 lb (102.5 kg)  BMI 34.36 kg/m  Wt Readings from Last 3 Encounters:  01/18/23 226 lb (102.5 kg)  01/03/22 226 lb 13.7 oz (102.9 kg)  12/20/21 227 lb (103 kg)    General: Well-appearing Eyes: sclera anicteric, no redness ENT: oral mucosa moist without lesions, no cervical or supraclavicular lymphadenopathy CV: Regular without appreciable murmur, no JVD, no peripheral edema Resp: clear to auscultation bilaterally, normal RR and effort noted GI: soft, obese, no tenderness, with active bowel sounds. No guarding or palpable organomegaly noted. Skin; warm and dry, no rash or jaundice noted Neuro: awake, alert and oriented x 3. Normal gross motor function and fluent speech  Labs:  Lab reports as noted above and included with other results in referral records scanned to chart.    Assessment: Encounter Diagnoses  Name Primary?   Heme positive stool Yes   Esophageal dysphagia     Heme positive stool on screening FOBT card.  We discussed the possibility of either upper or lower GI sources of blood loss such as ulcer, AVM or neoplasia. He also has a known history of biopsy-proven eosinophilic esophagitis with ongoing dysphagia and previous esophageal stricture requiring  balloon dilation after food impaction.  This needs to be reassessed as well.  Plan:  Upper endoscopy with possible dilation (and probable biopsies) as well as colonoscopy.  He was agreeable after discussion of procedure and risks.  The benefits and risks of the planned procedure were described in detail with the patient or (when appropriate) their health care proxy.  Risks were outlined as including, but not limited to, bleeding, infection, perforation, adverse medication reaction leading to cardiac or pulmonary decompensation, pancreatitis (if ERCP).  The limitation of incomplete mucosal visualization was also discussed.  No guarantees or warranties were given.   This chart carries a diagnosis of cardiomyopathy, no details currently known.  He appears to have very good functional status and has no physical exam signs of decompensated heart failure.  We have obtained the contact information for his cardiologist's office in [place] and will send a request for his most recent echocardiogram report.  This will allow full preprocedure evaluation by me and our anesthesia provider.  Thank you for the courtesy of this consult.  Please call me with any questions or concerns.  [name]  CC: Referring provider noted above

## 2023-02-16 ENCOUNTER — Telehealth: Payer: Self-pay | Admitting: Gastroenterology

## 2023-02-16 MED ORDER — PLENVU 140 G PO SOLR: 1.0000 | Freq: Once | ORAL | 0 refills | Status: AC

## 2023-02-16 NOTE — Telephone Encounter (Signed)
Inbound call from patient requesting for his prep medication for his procedure on 6/10 be sent to [facility] [place]. Please advise, thank you.

## 2023-02-16 NOTE — Telephone Encounter (Signed)
Plenvu sent to [facility] in [place]

## 2023-02-20 ENCOUNTER — Encounter: Payer: Self-pay | Admitting: Gastroenterology

## 2023-02-20 ENCOUNTER — Ambulatory Visit (AMBULATORY_SURGERY_CENTER): Payer: Medicare Other | Admitting: Gastroenterology

## 2023-02-20 VITALS — BP 173/103 | HR 59 | Temp 96.6°F | Resp 17 | Ht 68.0 in | Wt 226.0 lb

## 2023-02-20 DIAGNOSIS — D122 Benign neoplasm of ascending colon: Secondary | ICD-10-CM | POA: Diagnosis not present

## 2023-02-20 DIAGNOSIS — K2970 Gastritis, unspecified, without bleeding: Secondary | ICD-10-CM

## 2023-02-20 DIAGNOSIS — D123 Benign neoplasm of transverse colon: Secondary | ICD-10-CM

## 2023-02-20 DIAGNOSIS — D120 Benign neoplasm of cecum: Secondary | ICD-10-CM | POA: Diagnosis not present

## 2023-02-20 DIAGNOSIS — Z1211 Encounter for screening for malignant neoplasm of colon: Secondary | ICD-10-CM | POA: Diagnosis not present

## 2023-02-20 DIAGNOSIS — R195 Other fecal abnormalities: Secondary | ICD-10-CM

## 2023-02-20 DIAGNOSIS — R1319 Other dysphagia: Secondary | ICD-10-CM

## 2023-02-20 DIAGNOSIS — K635 Polyp of colon: Secondary | ICD-10-CM | POA: Diagnosis not present

## 2023-02-20 DIAGNOSIS — K222 Esophageal obstruction: Secondary | ICD-10-CM | POA: Diagnosis not present

## 2023-02-20 DIAGNOSIS — K319 Disease of stomach and duodenum, unspecified: Secondary | ICD-10-CM

## 2023-02-20 DIAGNOSIS — D124 Benign neoplasm of descending colon: Secondary | ICD-10-CM

## 2023-02-20 DIAGNOSIS — D125 Benign neoplasm of sigmoid colon: Secondary | ICD-10-CM

## 2023-02-20 MED ORDER — SODIUM CHLORIDE 0.9 % IV SOLN
500.0000 mL | INTRAVENOUS | Status: DC
Start: 2023-02-20 — End: 2023-02-20

## 2023-02-20 MED ORDER — OMEPRAZOLE 40 MG PO CPDR
40.0000 mg | DELAYED_RELEASE_CAPSULE | Freq: Every day | ORAL | 2 refills | Status: DC
Start: 2023-02-20 — End: 2023-03-06

## 2023-02-20 NOTE — Op Note (Signed)
[facility] Patient Name: Lucas Brock Procedure Date: 02/20/2023 9:36 AM MRN: [id number] Endoscopist: Sherilyn Cooter L. [name] Neither , MD, [id number] Age: 72 Referring MD:  Date of Birth: June 05, 1951 Gender: Male Account #: [id number] Procedure:                Colonoscopy Indications:              Heme positive stool (routine screening check,                            normal hemoglobin) Medicines:                Monitored Anesthesia Care Procedure:                Pre-Anesthesia Assessment:                           - Prior to the procedure, a History and Physical                            was performed, and patient medications and                            allergies were reviewed. The patient's tolerance of                            previous anesthesia was also reviewed. The risks                            and benefits of the procedure and the sedation                            options and risks were discussed with the patient.                            All questions were answered, and informed consent                            was obtained. Prior Anticoagulants: The patient has                            taken no anticoagulant or antiplatelet agents. ASA                            Grade Assessment: III - A patient with severe                            systemic disease. After reviewing the risks and                            benefits, the patient was deemed in satisfactory                            condition to undergo the procedure.  After obtaining informed consent, the colonoscope                            was passed under direct vision. Throughout the                            procedure, the patient's blood pressure, pulse, and                            oxygen saturations were monitored continuously. The                            Olympus SN [id number] was introduced through the anus                            and advanced to the the cecum,  identified by                            appendiceal orifice and ileocecal valve. The                            colonoscopy was performed with difficulty due to a                            redundant colon, significant looping and the                            patient's body habitus. Successful completion of                            the procedure was aided by using manual pressure                            and straightening and shortening the scope to                            obtain bowel loop reduction. The patient tolerated                            the procedure well. The quality of the bowel                            preparation was good. The ileocecal valve,                            appendiceal orifice, and rectum were photographed.                            The bowel preparation used was SUPREP via split                            dose instruction. Scope In: 9:39:39 AM Scope Out: 10:30:31 AM Scope Withdrawal Time: 0 hours 44 minutes 53 seconds  Total Procedure  Duration: 0 hours 50 minutes 52 seconds  Findings:                 The digital rectal exam findings include decreased                            sphincter tone.                           Repeat examination of right colon under NBI                            performed.                           Four sessile polyps were found in the cecum. The                            polyps were 2 to 12 mm in size. These polyps were                            removed with a cold snare. Resection and retrieval                            were complete. (Jar 1)                           Five sessile polyps were found in the ascending                            colon and cecum. The polyps were 2 to 8 mm in size.                            These polyps were removed with a cold snare.                            Resection and retrieval were complete. (Jar 2)                           Three sessile polyps were found in the ascending                             colon. The polyps were 3 to 10 mm in size. These                            polyps were removed with a cold snare. Resection                            and retrieval were complete. (Jar 3)                           Three sessile polyps were found in the ascending  colon. The polyps were 3 to 6 mm in size. These                            polyps were removed with a cold snare. Resection                            and retrieval were complete. (Jar 4)                           Three sessile polyps were found in the transverse                            colon. The polyps were 3 to 5 mm in size. These                            polyps were removed with a cold snare. Resection                            and retrieval were complete. (Jar 5)                           Six sessile polyps were found in the descending                            colon and transverse colon. The polyps were 3 to 10                            mm in size. These polyps were removed with a cold                            snare. Resection and retrieval were complete. (Jar                            6)                           Two sessile polyps were found in the sigmoid colon.                            The polyps were 4 to 6 mm in size. These polyps                            were removed with a cold snare. Resection and                            retrieval were complete. (Jar 7)                           The colon (entire examined portion) was                            significantly redundant.  Internal hemorrhoids were found.                           The exam was otherwise without abnormality on                            direct and retroflexion views. Complications:            No immediate complications. Estimated Blood Loss:     Estimated blood loss was minimal. Impression:               - Decreased sphincter tone found on digital rectal                             exam.                           - Four 2 to 12 mm polyps in the cecum, removed with                            a cold snare. Resected and retrieved.                           - Five 2 to 8 mm polyps in the ascending colon and                            in the cecum, removed with a cold snare. Resected                            and retrieved.                           - Three 3 to 10 mm polyps in the ascending colon,                            removed with a cold snare. Resected and retrieved.                           - Three 3 to 6 mm polyps in the ascending colon,                            removed with a cold snare. Resected and retrieved.                           - Three 3 to 5 mm polyps in the transverse colon,                            removed with a cold snare. Resected and retrieved.                           - Six 3 to 10 mm polyps in the descending colon and  in the transverse colon, removed with a cold snare.                            Resected and retrieved.                           - Two 4 to 6 mm polyps in the sigmoid colon,                            removed with a cold snare. Resected and retrieved.                           - Redundant colon.                           - Internal hemorrhoids.                           - The examination was otherwise normal on direct                            and retroflexion views.                           EGD findings likely explain heme positive stool.                            Multiple incidental polyps discovered. Recommendation:           - Patient has a contact number available for                            emergencies. The signs and symptoms of potential                            delayed complications were discussed with the                            patient. Return to normal activities tomorrow.                            Written discharge instructions were provided to the                             patient.                           - Resume previous diet.                           - Continue present medications.                           - Await pathology results.                           - Repeat colonoscopy in 1  year for surveillance.                           - See the other procedure note for documentation of                            additional recommendations. Albana Saperstein L. [name] Neither, MD 02/20/2023 10:58:05 AM This report has been signed electronically.

## 2023-02-20 NOTE — Op Note (Signed)
[facility] Patient Name: Lucas Brock Procedure Date: 02/20/2023 9:35 AM MRN: [id number] Endoscopist: Sherilyn Cooter L. [name] Neither , MD, [id number] Age: 72 Referring MD:  Date of Birth: 05-Jul-1951 Gender: Male Account #: [id number] Procedure:                Upper GI endoscopy Indications:              Esophageal dysphagia                           (Biopsy-proven eosinophilic esophagitis at outside                            GI clinic discovered during EGD for food impaction)                           See recent office consult note for clinical details Medicines:                Monitored Anesthesia Care Procedure:                Pre-Anesthesia Assessment:                           - Prior to the procedure, a History and Physical                            was performed, and patient medications and                            allergies were reviewed. The patient's tolerance of                            previous anesthesia was also reviewed. The risks                            and benefits of the procedure and the sedation                            options and risks were discussed with the patient.                            All questions were answered, and informed consent                            was obtained. Prior Anticoagulants: The patient has                            taken no anticoagulant or antiplatelet agents. ASA                            Grade Assessment: III - A patient with severe                            systemic disease. After reviewing the risks and  benefits, the patient was deemed in satisfactory                            condition to undergo the procedure.                           After obtaining informed consent, the endoscope was                            passed under direct vision. Throughout the                            procedure, the patient's blood pressure, pulse, and                            oxygen saturations were  monitored continuously. The                            Olympus Scope [id number] was introduced through the                            mouth, and advanced to the second part of duodenum.                            The upper GI endoscopy was accomplished without                            difficulty. The patient tolerated the procedure                            well. Scope In: Scope Out: Findings:                 Normal mucosa was found in the entire esophagus.                            Several biopsies were obtained in the middle third                            of the esophagus and in the lower third of the                            esophagus with cold forceps for histology.                           A 2 cm hiatal hernia was present.                           One benign-appearing, intrinsic moderate stenosis                            was found at the gastroesophageal junction. This                            stenosis measured less  than one cm (in length) and                            approximately 14 mm in diameter. Distal esophagus                            also somewhat tortuous from the hiatal hernia which                            is contributing to the stenosis. The stenosis was                            traversed. A TTS dilator was passed through the                            scope. Dilation with a 16-17-18 mm balloon dilator                            was performed to 18 mm. (Inflated balloon and also                            passed to/fro with scope movement for additional                            dilation) however the dilation site was examined                            and showed no change.                           Inflammation characterized by erosions, erythema                            and granularity was found in the entire examined                            stomach. Several biopsies were obtained in the                            gastric body and in the  gastric antrum with cold                            forceps for histology. (All gastric biopsies in 1                            pathology jar)                           The cardia and gastric fundus were normal on                            retroflexion. (Hill grade 4)  The examined duodenum was normal. Complications:            No immediate complications. Estimated Blood Loss:     Estimated blood loss was minimal. Impression:               - Normal mucosa was found in the entire esophagus.                           - 2 cm hiatal hernia.                           - Benign-appearing esophageal stenosis. Dilated.                           - Gastritis.                           - Normal examined duodenum.                           - Several biopsies were obtained in the middle                            third of the esophagus and in the lower third of                            the esophagus.                           - Several biopsies were obtained in the gastric                            body and in the gastric antrum. Recommendation:           - Patient has a contact number available for                            emergencies. The signs and symptoms of potential                            delayed complications were discussed with the                            patient. Return to normal activities tomorrow.                            Written discharge instructions were provided to the                            patient.                           - Resume previous diet.                           - Continue present medications.                           -  Await pathology results.                           - See the other procedure note for documentation of                            additional recommendations.                           - Omeprazole 40 mg once daily.Disp #30, RF 2                           Follow-up in my office in 8 to 12 weeks                            Closely follow diet and lifestyle antireflux                            measures, as these are at least as important as                            medicines to control symptoms, particularly in the                            setting of a hiatal hernia.                           Try not to eat within several hours of bed, and                            elevate head of bed with the bed wedge to decrease                            nocturnal regurgitation.                           Cut and chew food well, consume plenty of liquids                            with meals, especially given the hiatal hernia with                            distal esophageal tortuosity discovered and                            described above. Brentley Landfair L. [name] Neither, MD 02/20/2023 11:07:11 AM This report has been signed electronically.

## 2023-02-20 NOTE — Progress Notes (Signed)
Called to room to assist during endoscopic procedure.  Patient ID and intended procedure confirmed with present staff. Received instructions for my participation in the procedure from the performing physician.  

## 2023-02-20 NOTE — Progress Notes (Signed)
History and Physical:  This patient presents for endoscopic testing for: Encounter Diagnoses  Name Primary?   Heme positive stool Yes   Esophageal dysphagia     Clinical details in 01/18/23 office consult note. Dysphagia, Hx of EoE at outside practice.  Heme positive stool, normal Hgb  Patient is otherwise without complaints or active issues today.   Past Medical History: Past Medical History:  Diagnosis Date   Asthma    Congenital absence of one kidney    Coronary artery disease    Dyspnea 2022   with exertion   HFrEF (heart failure with reduced ejection fraction) (HCC)    History of kidney stones    Hypertension    LBBB (left bundle branch block)    Nonischemic cardiomyopathy (HCC)    Pneumonia 2020   Stroke [facility])    TIA age 72     Past Surgical History: Past Surgical History:  Procedure Laterality Date   ESOPHAGOGASTRODUODENOSCOPY N/A 04/23/2016   Procedure: ESOPHAGOGASTRODUODENOSCOPY (EGD);  Surgeon: Malissa Hippo, MD;  [facility];  Service: Endoscopy;  Laterality: N/A;   KIDNEY STONE SURGERY     age 24 with ruptured ureter   KNEE SURGERY Bilateral    2013, LT, Rt 2003   ROBOTIC ASSITED PARTIAL NEPHRECTOMY Right 01/03/2022   Procedure: XI ROBOTIC ASSITED PARTIAL NEPHRECTOMY, POSSIBLE RADICAL NEPHRECTOMY;  Surgeon: Jannifer Hick, MD;  Location: [facility];  Service: Urology;  Laterality: Right;  ONLY NEEDS 240 MIN   TONSILLECTOMY     TRANSURETHRAL RESECTION OF PROSTATE     age 22   TURP VAPORIZATION      Allergies: Allergies  Allergen Reactions   Sulfamethoxazole-Trimethoprim Anaphylaxis   Ciprofloxacin     Other reaction(s): Dizziness    Outpatient Meds: Current Outpatient Medications  Medication Sig Dispense Refill   acetaminophen (TYLENOL) 325 MG tablet Take 650 mg by mouth every 6 (six) hours as needed for moderate pain, mild pain, fever or headache.     albuterol (VENTOLIN HFA) 108 (90 Base) MCG/ACT inhaler Inhale 2 puffs into the lungs  every 4 (four) hours as needed for wheezing or shortness of breath. 18 g 0   carvedilol (COREG) 6.25 MG tablet Take 6.25 mg by mouth 2 (two) times daily with a meal.     doxycycline (VIBRAMYCIN) 100 MG capsule Take 100 mg by mouth daily.     ENTRESTO 24-26 MG Take 2 tablets by mouth 2 (two) times daily. 75mg      meloxicam (MOBIC) 15 MG tablet Take 15 mg by mouth daily.     promethazine-dextromethorphan (PROMETHAZINE-DM) 6.25-15 MG/5ML syrup Take 5 mLs by mouth 4 (four) times daily as needed. 100 mL 0   diphenhydrAMINE HCl (ALKA-SELTZER PLUS ALLERGY PO) Take by mouth.     docusate sodium (COLACE) 100 MG capsule Take 1 capsule (100 mg total) by mouth 2 (two) times daily.     HYDROcodone-acetaminophen (NORCO) 5-325 MG tablet Take 1-2 tablets by mouth every 6 (six) hours as needed for moderate pain or severe pain. 20 tablet 0   predniSONE (DELTASONE) 20 MG tablet Take 2 tablets (40 mg total) by mouth daily with breakfast. (Patient not taking: Reported on 01/18/2023) 10 tablet 0   Current Facility-Administered Medications  Medication Dose Route Frequency Provider Last Rate Last Admin   0.9 %  sodium chloride infusion  500 mL Intravenous Continuous Danis, Andreas Blower, MD          ___________________________________________________________________ Objective   Exam:  BP Marland Kitchen)  154/91   Pulse 60   Temp (!) 96.6 F (35.9 C) (Skin)   Ht 5\' 8"  (1.727 m)   Wt 226 lb (102.5 kg)   SpO2 94%   BMI 34.36 kg/m   CV: regular , S1/S2 Resp: clear to auscultation bilaterally, normal RR and effort noted GI: soft, no tenderness, with active bowel sounds.   Assessment: Encounter Diagnoses  Name Primary?   Heme positive stool Yes   Esophageal dysphagia      Plan: Colonoscopy EGD  The benefits and risks of the planned procedure were described in detail with the patient or (when appropriate) their health care proxy.  Risks were outlined as including, but not limited to, bleeding, infection,  perforation, adverse medication reaction leading to cardiac or pulmonary decompensation, pancreatitis (if ERCP).  The limitation of incomplete mucosal visualization was also discussed.  No guarantees or warranties were given.  The patient is appropriate for an endoscopic procedure in the ambulatory setting.   - Amada Jupiter, MD

## 2023-02-20 NOTE — Progress Notes (Signed)
Patient states there have been no changes to medical or surgical history since time of pre-visit. 

## 2023-02-20 NOTE — Patient Instructions (Addendum)
Continue present medications. Await pathology results. Repeat colonoscopy in 1 year for surveillance Omeprazole 40 mg once daily.Disp #30, RF 2- sent to [facility] Follow-up in my office in 8 to 12 weeks Closely follow diet and lifestyle antireflux measures, as these are at least as important as medicines to control symptoms, particularly in the setting of a hiatal hernia. Try not to eat within several hours of bed, and elevate head of bed with the bed wedge to decrease nocturnal regurgitation. Cut and chew food well, consume plenty of liquids with meals, especially given the hiatal hernia with distal esophageal tortuosity discovered and  described above.                                                                                Please read over handout about polyps     YOU HAD AN ENDOSCOPIC PROCEDURE TODAY AT THE [facility]:   Refer to the procedure report that was given to you for any specific questions about what was found during the examination.  If the procedure report does not answer your questions, please call your gastroenterologist to clarify.  If you requested that your care partner not be given the details of your procedure findings, then the procedure report has been included in a sealed envelope for you to review at your convenience later.  YOU SHOULD EXPECT: Some feelings of bloating in the abdomen. Passage of more gas than usual.  Walking can help get rid of the air that was put into your GI tract during the procedure and reduce the bloating. If you had a lower endoscopy (such as a colonoscopy or flexible sigmoidoscopy) you may notice spotting of blood in your stool or on the toilet paper. If you underwent a bowel prep for your procedure, you may not have a normal bowel movement for a few days.  Please Note:  You might notice some irritation and congestion in your nose or some drainage.  This is from the oxygen used during your procedure.  There is no need for  concern and it should clear up in a day or so.  SYMPTOMS TO REPORT IMMEDIATELY:  Following lower endoscopy (colonoscopy or flexible sigmoidoscopy):  Excessive amounts of blood in the stool  Significant tenderness or worsening of abdominal pains  Swelling of the abdomen that is new, acute  Fever of 100F or higher  Following upper endoscopy (EGD)  Vomiting of blood or coffee ground material  New chest pain or pain under the shoulder blades  Painful or persistently difficult swallowing  New shortness of breath  Fever of 100F or higher  Black, tarry-looking stools  For urgent or emergent issues, a gastroenterologist can be reached at any hour by calling [phone]. Do not use MyChart messaging for urgent concerns.    DIET:  We do recommend a small meal at first, but then you may proceed to your regular diet.   Drink plenty of fluids but you should avoid alcoholic beverages for 24 hours.  ACTIVITY:  You should plan to take it easy for the rest of today and you should NOT DRIVE or use heavy machinery until tomorrow (because of the sedation medicines used during  the test).    FOLLOW UP: Our staff will call the number listed on your records the next business day following your procedure.  We will call around 7:15- 8:00 am to check on you and address any questions or concerns that you may have regarding the information given to you following your procedure. If we do not reach you, we will leave a message.     If any biopsies were taken you will be contacted by phone or by letter within the next 1-3 weeks.  Please call us at [phone] if you have not heard about the biopsies in 3 weeks.    SIGNATURES/CONFIDENTIALITY: You and/or your care partner have signed paperwork which will be entered into your electronic medical record.  These signatures attest to the fact that that the information above on your After Visit Summary has been reviewed and is understood.  Full responsibility of  the confidentiality of this discharge information lies with you and/or your care-partner.  ______________________________________________    _______________________________________________________  Food Guidelines for those with chronic digestive trouble:  Many people have difficulty digesting certain foods, causing a variety of distressing and embarrassing symptoms such as abdominal pain, bloating and gas.  These foods may need to be avoided or consumed in small amounts.  Here are some tips that might be helpful for you.  1.   Lactose intolerance is the difficulty or complete inability to digest lactose, the natural sugar in milk and anything made from milk.  This condition is harmless, common, and can begin any time during life.  Some people can digest a modest amount of lactose while others cannot tolerate any.  Also, not all dairy products contain equal amounts of lactose.  For example, hard cheeses such as parmesan have less lactose than soft cheeses such as cheddar.  Yogurt has less lactose than milk or cheese.  Many packaged foods (even many brands of bread) have milk, so read ingredient lists carefully.  It is difficult to test for lactose intolerance, so just try avoiding lactose as much as possible for a week and see what happens with your symptoms.  If you seem to be lactose intolerant, the best plan is to avoid it (but make sure you get calcium from another source).  The next best thing is to use lactase enzyme supplements, available over the counter everywhere.  Just know that many lactose intolerant people need to take several tablets with each serving of dairy to avoid symptoms.  Lastly, a lot of restaurant food is made with milk or butter.  Many are things you might not suspect, such as mashed potatoes, rice and pasta (cooked with butter) and "grilled" items.  If you are lactose intolerant, it never hurts to ask your server what has milk or butter.  2.   Fiber is an important part of  your diet, but not all fiber is well-tolerated.  Insoluble fiber such as bran is often consumed by normal gut bacteria and converted into gas.  Soluble fiber such as oats, squash, carrots and green beans are typically tolerated better.  3.   Some types of carbohydrates can be poorly digested.  Examples include: fructose (apples, cherries, pears, raisins and other dried fruits), fructans (onions, zucchini, large amounts of wheat), sorbitol/mannitol/xylitol and sucralose/Splenda (common artificial sweeteners), and raffinose (lentils, broccoli, cabbage, asparagus, brussel sprouts, many types of beans).  Do a Programmer, multimedia for [employer] [place] and you will find helpful information. Beano, a dietary supplement, will often help with raffinose-containing  foods.  As with lactase tablets, you may need several per serving.  4.   Whenever possible, avoid processed food&meats and chemical additives.  High fructose corn syrup, a common sweetener, may be difficult to digest.  Eggs and soy (comes from the soybean, and added to many foods now) are other common bloating/gassy foods.  5.  Regarding gluten:  gluten is a protein mainly found in wheat, but also rye and barley.  There is a condition called celiac sprue, which is an inflammatory reaction in the small intestine causing a variety of digestive symptoms.  Blood testing is highly reliable to look for this condition, and sometimes upper endoscopy with small bowel biopsies may be necessary to make the diagnosis.  Many patients who test negative for celiac sprue report improvement in their digestive symptoms when they switch to a gluten-free diet.  However, in these "non-celiac gluten sensitive" patients, the true role of gluten in their symptoms is unclear.  Reducing carbohydrates in general may decrease the gas and bloating caused when gut bacteria consume carbs. Also, some of these patients may actually be intolerant of the baker's yeast in bread products rather than the  gluten.  Flatbread and other reduced yeast breads might therefore be tolerated.  There is no specific testing available for most food intolerances, which are discovered mainly by dietary elimination.  Please do not embark on a gluten free diet unless directed by your doctor, as it is highly restrictive, and may lead to nutritional deficiencies if not carefully monitored.  Lastly, beware of internet claims offering "personalized" tests for food intolerances.  Such testing has no reliable scientific evidence to support its reliability and correlation to symptoms.    6.  The best advice is old advice, especially for those with chronic digestive trouble - try to eat "clean".  Balanced diet, avoid processed food, plenty of fruits and vegetables, cut down the sugar, minimal alcohol, avoid tobacco. Make time to care for yourself, get enough sleep, exercise when you can, reduce stress.  Your guts will thank you for it.   - Dr. Sherlynn [facility]  ____________________________________________________________

## 2023-02-20 NOTE — Progress Notes (Signed)
To pacu, VSS. Report to [email or web address].tb 

## 2023-02-20 NOTE — Progress Notes (Signed)
No need for dilation diet per Dr. [name] Neither

## 2023-02-21 ENCOUNTER — Telehealth: Payer: Self-pay | Admitting: *Deleted

## 2023-02-21 NOTE — Telephone Encounter (Signed)
Follow up call attempt.  Unable to leave a message. 

## 2023-03-01 ENCOUNTER — Encounter: Payer: Self-pay | Admitting: Gastroenterology

## 2023-03-04 ENCOUNTER — Other Ambulatory Visit: Payer: Self-pay | Admitting: Gastroenterology

## 2023-03-04 DIAGNOSIS — R1319 Other dysphagia: Secondary | ICD-10-CM

## 2023-03-13 ENCOUNTER — Telehealth: Payer: Self-pay | Admitting: Gastroenterology

## 2023-03-13 NOTE — Telephone Encounter (Signed)
Inbound cal from patient inquiring about results from recent procedure. Please advise.  Thank you

## 2023-03-13 NOTE — Telephone Encounter (Signed)
I have attempted to return patient's call. Unfortunately I get no answer and voicemail is not set up. Will reach out again at a later time.

## 2023-03-14 NOTE — Telephone Encounter (Signed)
Again attempted to reach patient. No answer, no voicemail. Patient also does not have mychart in which we can respond.

## 2023-03-14 NOTE — Telephone Encounter (Signed)
I have spoken to patient who inquires about recent endoscopy/colonoscopy results as he says he has received no correspondence. Advised that a letter was mailed 03/01/23, so he should get this in the mail soon. I read patient the detailed letter with results and Dr [name] Neither' recommendations. Patient verbalizes understanding and denies any additional questions.  He states that he is taking his PPI as directed. He is scheduled for follow up 05/2023.

## 2023-05-30 ENCOUNTER — Encounter: Payer: Self-pay | Admitting: Gastroenterology

## 2023-05-30 ENCOUNTER — Ambulatory Visit (INDEPENDENT_AMBULATORY_CARE_PROVIDER_SITE_OTHER): Payer: Medicare Other | Admitting: Gastroenterology

## 2023-05-30 VITALS — BP 132/84 | HR 72 | Ht 66.5 in | Wt 235.0 lb

## 2023-05-30 DIAGNOSIS — K449 Diaphragmatic hernia without obstruction or gangrene: Secondary | ICD-10-CM

## 2023-05-30 DIAGNOSIS — R1319 Other dysphagia: Secondary | ICD-10-CM | POA: Diagnosis not present

## 2023-05-30 DIAGNOSIS — Z86010 Personal history of colonic polyps: Secondary | ICD-10-CM

## 2023-05-30 NOTE — Progress Notes (Signed)
[facility] progress note:  History: Lucas Brock 05/30/2023    Reason for consult/chief complaint: Dysphagia (Better since EGD, no complaints today)   Subjective  HPI: Summary of GI history: Seen by Dr. [name] Neither in office consultation May 2024 after referral from primary care for routine FOBT positive, hemoglobin 14.  Patient also noted to have 2017 history of dysphagia with Dr. [name] requiring EGD with balloon dilation of stricture and biopsies suggesting increased eosinophils. EGD and colonoscopy June 2024.   26 colon polyps removed, with one was hyperplastic and remainder tubular adenoma and SSP.  1 year recall recommended.        EGD with hiatal hernia causing distal esophageal tortuosity.  Approximately 14 mm distal esophageal stricture encountered without mucosal abnormalities.  18 mm balloon inflated and moved to and fro without resistance or mucosal rent.  Distal and mid esophageal biopsies were normal. ____________  He is accompanied by his wife today.   Today, he reports feeling well overall. His reflux symptoms have significantly improved since his EGD. He has been following lifestyle recommendations to help prevent his symptoms. He reports that typically he does not experience any reflux or heartburn symptoms but would occasionally experience it after having certain foods or once every 3 months or so. He continues to take Omeprazole 40 mg once a day at bedtime. He is also wondering if weigh loss will help relief his symptoms.    We reviewed his latest EGD and colonoscopy and discussed the results along with his false positive hem stool tests and diagnosis of a hiatal hernia and many colon polyps.   Patient denies diarrhea, constipation, nausea, blood in stool, black stool, vomiting, abdominal pain, bloating, unintentional weight loss, dysphagia.   ROS:  Review of Systems  Constitutional:  Negative for appetite change and fever.  HENT:  Negative for  trouble swallowing.   Respiratory:  Negative for cough and shortness of breath.   Cardiovascular:  Negative for chest pain.  Gastrointestinal:  Negative for abdominal distention, abdominal pain, anal bleeding, blood in stool, constipation, diarrhea, nausea, rectal pain and vomiting.       +reflux  Genitourinary:  Negative for dysuria.  Musculoskeletal:  Negative for back pain.  Skin:  Negative for rash.  Neurological:  Negative for weakness.  All other systems reviewed and are negative.    Past Medical History: Past Medical History:  Diagnosis Date   Asthma    Congenital absence of one kidney    Coronary artery disease    Dyspnea 2022   with exertion   HFrEF (heart failure with reduced ejection fraction) (HCC)    History of kidney stones    Hypertension    LBBB (left bundle branch block)    Nonischemic cardiomyopathy (HCC)    Pneumonia 2020   Stroke [facility])    TIA age 75     Past Surgical History: Past Surgical History:  Procedure Laterality Date   ESOPHAGOGASTRODUODENOSCOPY N/A 04/23/2016   Procedure: ESOPHAGOGASTRODUODENOSCOPY (EGD);  Surgeon: Malissa Hippo, MD;  [facility];  Service: Endoscopy;  Laterality: N/A;   KIDNEY STONE SURGERY     age 19 with ruptured ureter   KNEE SURGERY Bilateral    2013, LT, Rt 2003   ROBOTIC ASSITED PARTIAL NEPHRECTOMY Right 01/03/2022   Procedure: XI ROBOTIC ASSITED PARTIAL NEPHRECTOMY, POSSIBLE RADICAL NEPHRECTOMY;  Surgeon: Jannifer Hick, MD;  Location: [facility];  Service: Urology;  Laterality: Right;  ONLY NEEDS 240 MIN   TONSILLECTOMY  TRANSURETHRAL RESECTION OF PROSTATE     age 57   TURP VAPORIZATION       Family History: Family History  Problem Relation Age of Onset   High blood pressure Mother    Diabetes Father    Colon cancer Neg Hx    Esophageal cancer Neg Hx    Liver disease Neg Hx    Rectal cancer Neg Hx    Stomach cancer Neg Hx     Social History: Social History   Socioeconomic History    Marital status: Single    Spouse name: Not on file   Number of children: 3   Years of education: Not on file   Highest education level: Not on file  Occupational History   Occupation: retired  Tobacco Use   Smoking status: Former    Current packs/day: 0.00    Average packs/day: 3.0 packs/day for 10.0 years (30.0 ttl pk-yrs)    Types: Cigarettes    Start date: 31    Quit date: 1982    Years since quitting: 42.7   Smokeless tobacco: Never  Vaping Use   Vaping status: Never Used  Substance and Sexual Activity   Alcohol use: Not Currently    Comment: social   Drug use: No   Sexual activity: Not on file  Other Topics Concern   Not on file  Social History Narrative   Not on file   Social Determinants of Health   Financial Resource Strain: Not on file  Food Insecurity: Not on file  Transportation Needs: Not on file  Physical Activity: Not on file  Stress: Not on file  Social Connections: Not on file    Allergies: Allergies  Allergen Reactions   Sulfamethoxazole-Trimethoprim Anaphylaxis   Ciprofloxacin     Other reaction(s): Dizziness    Outpatient Meds: Current Outpatient Medications  Medication Sig Dispense Refill   acetaminophen (TYLENOL) 325 MG tablet Take 650 mg by mouth every 6 (six) hours as needed for moderate pain, mild pain, fever or headache.     albuterol (VENTOLIN HFA) 108 (90 Base) MCG/ACT inhaler Inhale 2 puffs into the lungs every 4 (four) hours as needed for wheezing or shortness of breath. 18 g 0   carvedilol (COREG) 6.25 MG tablet Take 6.25 mg by mouth 2 (two) times daily with a meal.     cetirizine (ZYRTEC) 10 MG tablet Take 10 mg by mouth daily.     diphenhydrAMINE HCl (ALKA-SELTZER PLUS ALLERGY PO) Take by mouth.     docusate sodium (COLACE) 100 MG capsule Take 1 capsule (100 mg total) by mouth 2 (two) times daily.     HYDROcodone-acetaminophen (NORCO) 5-325 MG tablet Take 1-2 tablets by mouth every 6 (six) hours as needed for moderate pain or  severe pain. 20 tablet 0   meloxicam (MOBIC) 15 MG tablet Take 15 mg by mouth daily.     Omega-3 Fatty Acids (FISH OIL) 500 MG CAPS Take 1 capsule by mouth daily.     omeprazole (PRILOSEC) 40 MG capsule TAKE 1 CAPSULE (40 MG TOTAL) BY MOUTH DAILY. 90 capsule 1   promethazine-dextromethorphan (PROMETHAZINE-DM) 6.25-15 MG/5ML syrup Take 5 mLs by mouth 4 (four) times daily as needed. 100 mL 0   sacubitril-valsartan (ENTRESTO) 97-103 MG Take 1 tablet by mouth 2 (two) times daily.     TRELEGY ELLIPTA 100-62.5-25 MCG/ACT AEPB Inhale 1 puff into the lungs daily.     No current facility-administered medications for this visit.      ___________________________________________________________________ Objective  Exam:  BP 132/84 (BP Location: Left Arm, Patient Position: Sitting, Cuff Size: Normal)   Pulse 72   Ht 5' 6.5" (1.689 m) Comment: height measured without shoes  Wt 235 lb (106.6 kg)   BMI 37.36 kg/m  Wt Readings from Last 3 Encounters:  05/30/23 235 lb (106.6 kg)  02/20/23 226 lb (102.5 kg)  01/18/23 226 lb (102.5 kg)    General: well-appearing   Eyes: sclera anicteric, no redness ENT: oral mucosa moist without lesions, no cervical or supraclavicular lymphadenopathy CV: RRR, no JVD, no peripheral edema Resp: clear to auscultation bilaterally, normal RR and effort noted GI: soft, no tenderness, with active bowel sounds. No guarding or palpable organomegaly noted. Skin; warm and dry, no rash or jaundice noted Neuro: awake, alert and oriented x 3. Normal gross motor function and fluent speech  Labs:   Radiologic Studies:   Assessment: Esophageal dysphagia  Hiatal hernia  History of colon polyps  Dysphagia that seems largely related to distal esophageal tortuosity from the hiatal hernia.  Apparent stricture did not improve endoscopically with balloon dilation.  I suspect some improvement in symptoms or was related to him being more cautious eating and perhaps better  control of nocturnal reflux. We discussed his endoscopic findings and plan, diagrams of the anatomy shown, all questions answered.  Lastly, he feels that he weighs too much for the little that he eats and wonders if there may be some way to "jumpstart" his metabolism.  He was inquiring if weight loss might help his reflux symptoms, and I told him I suspect it probably would.  I left open the offer of a referral to the [facility].  Plan:  -Decrease pantoprazole 40 mg to once every other day until current supply runs out, and afterward switch to 20 mg OTC Prilosec twice weekly.  Contact me as needed.  Neck schedule colonoscopy approximately June 2025.   - Amada Jupiter, MD    [facility]    Ladona Mow M Kadhim,acting as a scribe for [name], MD.,have documented all relevant documentation on the behalf of Sherrilyn Rist, MD,as directed by  Sherrilyn Rist, MD while in the presence of Sherrilyn Rist, MD.   Marvis Repress III, MD, have reviewed all documentation for this visit. The documentation on 05/30/23 for the exam, diagnosis, procedures, and orders are all accurate and complete.    CC: Referring provider noted above

## 2023-05-30 NOTE — Patient Instructions (Addendum)
_______________________________________________________  If your blood pressure at your visit was 140/90 or greater, please contact your primary care physician to follow up on this.  _______________________________________________________  If you are age 70 or older, your body mass index should be between 23-30. Your Body mass index is 37.36 kg/m. If this is out of the aforementioned range listed, please consider follow up with your Primary Care Provider.  If you are age 1 or younger, your body mass index should be between 19-25. Your Body mass index is 37.36 kg/m. If this is out of the aformentioned range listed, please consider follow up with your Primary Care Provider.   ________________________________________________________  The [facility] providers would like to encourage you to use [facility] to communicate with providers for non-urgent requests or questions.  Due to long hold times on the telephone, sending your provider a message by [facility] may be a faster and more efficient way to get a response.  Please allow 48 business hours for a response.  Please remember that this is for non-urgent requests.  _______________________________________________________  Decrease your prescription omeprazole 40mg  to every other night until your current supply runs out. Then start over the counter Prilosec 20 mg twice a week.   It was a pleasure to see you today!  Thank you for trusting me with your gastrointestinal care!

## 2023-05-30 NOTE — Progress Notes (Deleted)
[facility] progress note:  History: Lucas Brock 05/30/2023  Referring provider: Pcp, No  Reason for consult/chief complaint: No chief complaint on file.   Subjective  HPI: Summary of GI history: Seen by Dr. [name] Neither in office consultation May 2024 after referral from primary care for routine FOBT positive, hemoglobin 14.  Patient also noted to have 2017 history of dysphagia with Dr. Karilyn Brock requiring EGD with balloon dilation of stricture and biopsies suggesting increased eosinophils. EGD and colonoscopy June 2024.   26 colon polyps removed, with one was hyperplastic and remainder tubular adenoma and SSP.  1 year recall recommended.        EGD with hiatal hernia causing distal esophageal tortuosity.  Approximately 14 mm distal esophageal stricture encountered without mucosal abnormalities.  18 mm balloon inflated and moved to and fro without resistance or mucosal rent.  Distal and mid esophageal biopsies were normal. ____________  ***   ROS:  Review of Systems   Past Medical History: Past Medical History:  Diagnosis Date   Asthma    Congenital absence of one kidney    Coronary artery disease    Dyspnea 2022   with exertion   HFrEF (heart failure with reduced ejection fraction) (HCC)    History of kidney stones    Hypertension    LBBB (left bundle branch block)    Nonischemic cardiomyopathy (HCC)    Pneumonia 2020   Stroke [facility])    TIA age 9     Past Surgical History: Past Surgical History:  Procedure Laterality Date   ESOPHAGOGASTRODUODENOSCOPY N/A 04/23/2016   Procedure: ESOPHAGOGASTRODUODENOSCOPY (EGD);  Surgeon: Malissa Hippo, MD;  Location: [facility];  Service: Endoscopy;  Laterality: N/A;   KIDNEY STONE SURGERY     age 5 with ruptured ureter   KNEE SURGERY Bilateral    2013, LT, Rt 2003   ROBOTIC ASSITED PARTIAL NEPHRECTOMY Right 01/03/2022   Procedure: XI ROBOTIC ASSITED PARTIAL NEPHRECTOMY, POSSIBLE RADICAL NEPHRECTOMY;   Surgeon: Lucas Hick, MD;  Location: [facility];  Service: Urology;  Laterality: Right;  ONLY NEEDS 240 MIN   TONSILLECTOMY     TRANSURETHRAL RESECTION OF PROSTATE     age 72   TURP VAPORIZATION       Family History: Family History  Problem Relation Age of Onset   High blood pressure Mother    Diabetes Father    Colon cancer Neg Hx    Esophageal cancer Neg Hx    Liver disease Neg Hx    Rectal cancer Neg Hx    Stomach cancer Neg Hx     Social History: Social History   Socioeconomic History   Marital status: Single    Spouse name: Not on file   Number of children: 3   Years of education: Not on file   Highest education level: Not on file  Occupational History   Occupation: retired  Tobacco Use   Smoking status: Former    Current packs/day: 0.00    Average packs/day: 3.0 packs/day for 10.0 years (30.0 ttl pk-yrs)    Types: Cigarettes    Start date: 81    Quit date: 1982    Years since quitting: 42.7   Smokeless tobacco: Never  Vaping Use   Vaping status: Never Used  Substance and Sexual Activity   Alcohol use: Not Currently    Comment: social   Drug use: No   Sexual activity: Not on file  Other Topics Concern   Not on file  Social History Narrative   Not on file   Social Determinants of Health   Financial Resource Strain: Not on file  Food Insecurity: Not on file  Transportation Needs: Not on file  Physical Activity: Not on file  Stress: Not on file  Social Connections: Not on file    Allergies: Allergies  Allergen Reactions   Sulfamethoxazole-Trimethoprim Anaphylaxis   Ciprofloxacin     Other reaction(s): Dizziness    Outpatient Meds: Current Outpatient Medications  Medication Sig Dispense Refill   acetaminophen (TYLENOL) 325 MG tablet Take 650 mg by mouth every 6 (six) hours as needed for moderate pain, mild pain, fever or headache.     albuterol (VENTOLIN HFA) 108 (90 Base) MCG/ACT inhaler Inhale 2 puffs into the lungs every 4 (four) hours as  needed for wheezing or shortness of breath. 18 g 0   carvedilol (COREG) 6.25 MG tablet Take 6.25 mg by mouth 2 (two) times daily with a meal.     diphenhydrAMINE HCl (ALKA-SELTZER PLUS ALLERGY PO) Take by mouth.     docusate sodium (COLACE) 100 MG capsule Take 1 capsule (100 mg total) by mouth 2 (two) times daily.     doxycycline (VIBRAMYCIN) 100 MG capsule Take 100 mg by mouth daily.     ENTRESTO 24-26 MG Take 2 tablets by mouth 2 (two) times daily. 75mg      HYDROcodone-acetaminophen (NORCO) 5-325 MG tablet Take 1-2 tablets by mouth every 6 (six) hours as needed for moderate pain or severe pain. 20 tablet 0   meloxicam (MOBIC) 15 MG tablet Take 15 mg by mouth daily.     omeprazole (PRILOSEC) 40 MG capsule TAKE 1 CAPSULE (40 MG TOTAL) BY MOUTH DAILY. 90 capsule 1   predniSONE (DELTASONE) 20 MG tablet Take 2 tablets (40 mg total) by mouth daily with breakfast. (Patient not taking: Reported on 01/18/2023) 10 tablet 0   promethazine-dextromethorphan (PROMETHAZINE-DM) 6.25-15 MG/5ML syrup Take 5 mLs by mouth 4 (four) times daily as needed. 100 mL 0   No current facility-administered medications for this visit.      ___________________________________________________________________ Objective   Exam:  There were no vitals taken for this visit. Wt Readings from Last 3 Encounters:  02/20/23 226 lb (102.5 kg)  01/18/23 226 lb (102.5 kg)  01/03/22 226 lb 13.7 oz (102.9 kg)    General: ***  Eyes: sclera anicteric, no redness ENT: oral mucosa moist without lesions, no cervical or supraclavicular lymphadenopathy CV: ***, no JVD, no peripheral edema Resp: clear to auscultation bilaterally, normal RR and effort noted GI: soft, *** tenderness, with active bowel sounds. No guarding or palpable organomegaly noted. Skin; warm and dry, no rash or jaundice noted Neuro: awake, alert and oriented x 3. Normal gross motor function and fluent speech  Labs:  ***  Radiologic  Studies:  ***  Assessment: No diagnosis found.  ***  Plan:  ***  Thank you for the courtesy of this consult.  Please call me with any questions or concerns.  [name]  CC: Referring provider noted above

## 2024-02-27 IMAGING — DX DG CHEST 1V PORT
1 series · 1 of 1 positions shown · non-contrast
Comparison: 10/04/2021 chest radiograph.

CLINICAL DATA: Dyspnea

EXAM:
PORTABLE CHEST 1 VIEW

[chest ap]
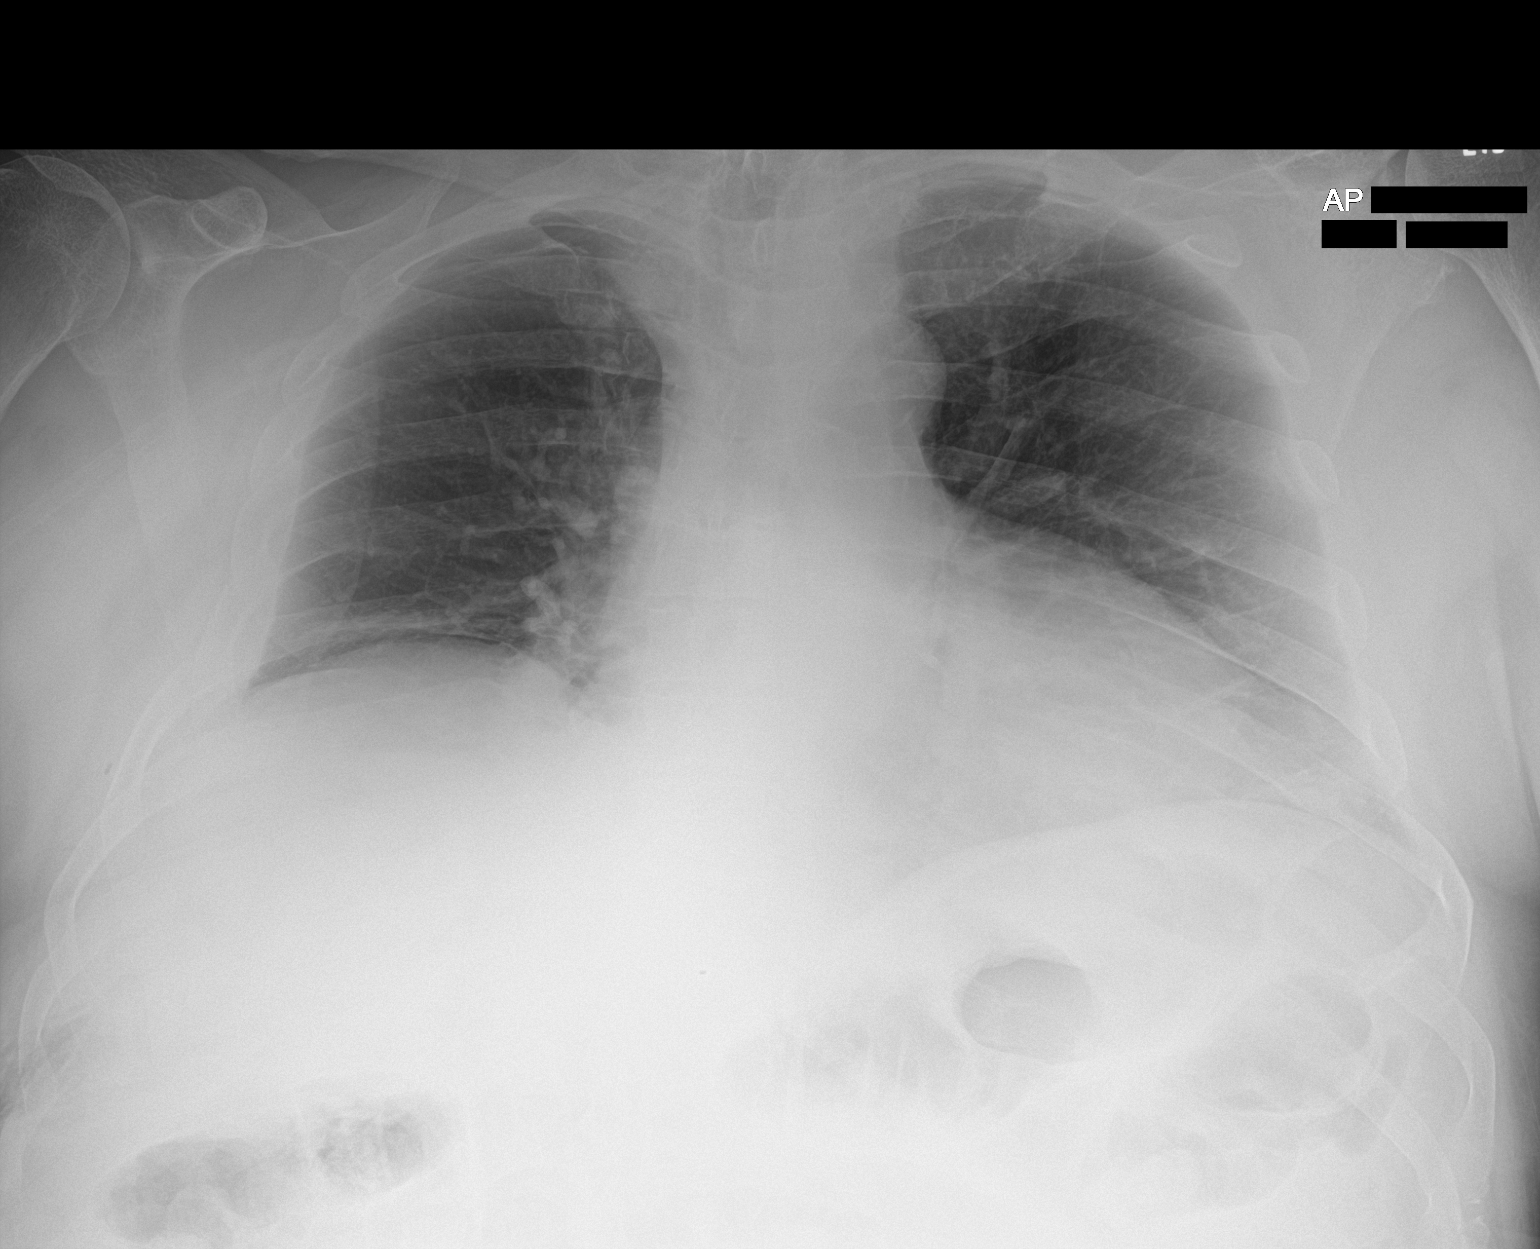

[1 of 1 positions shown; findings below may reference images not displayed]

FINDINGS: Low lung volumes. Stable cardiomediastinal silhouette with
top-normal heart size. No pneumothorax. No pleural effusion. Chronic
mild lateral right pleural thickening. No pulmonary edema. Mild
bibasilar atelectasis.
IMPRESSION: Low lung volumes with mild bibasilar atelectasis.

## 2024-03-05 ENCOUNTER — Encounter: Payer: Self-pay | Admitting: Gastroenterology
